# Patient Record
Sex: Female | Born: 1985 | Race: White | Hispanic: No | Marital: Single | Smoking: Former smoker
Health system: Southern US, Community
[De-identification: ages and names within clinical notes are randomized; demographics above are authoritative.]

## PROBLEM LIST (undated history)

## (undated) DIAGNOSIS — M5430 Sciatica, unspecified side: Secondary | ICD-10-CM

## (undated) DIAGNOSIS — M419 Scoliosis, unspecified: Secondary | ICD-10-CM

## (undated) HISTORY — PX: OTHER SURGICAL HISTORY: SHX169

---

## 2013-02-13 ENCOUNTER — Encounter (HOSPITAL_COMMUNITY): Payer: Self-pay | Admitting: Emergency Medicine

## 2013-02-13 ENCOUNTER — Emergency Department (HOSPITAL_COMMUNITY)
Admission: EM | Admit: 2013-02-13 | Discharge: 2013-02-13 | Disposition: A | Discharge status: Home / Self Care | Payer: BC Managed Care – PPO | Attending: Emergency Medicine | Admitting: Emergency Medicine

## 2013-02-13 DIAGNOSIS — J069 Acute upper respiratory infection, unspecified: Secondary | ICD-10-CM

## 2013-02-13 DIAGNOSIS — R112 Nausea with vomiting, unspecified: Secondary | ICD-10-CM | POA: Insufficient documentation

## 2013-02-13 DIAGNOSIS — J111 Influenza due to unidentified influenza virus with other respiratory manifestations: Secondary | ICD-10-CM

## 2013-02-13 DIAGNOSIS — F17200 Nicotine dependence, unspecified, uncomplicated: Secondary | ICD-10-CM | POA: Insufficient documentation

## 2013-02-13 MED ORDER — ACETAMINOPHEN 325 MG PO TABS
650.0000 mg | ORAL_TABLET | Freq: Once | ORAL | Status: AC
  Administered 2013-02-13: 650 mg via ORAL
  Filled 2013-02-13: qty 2

## 2013-02-13 MED ORDER — ONDANSETRON 8 MG PO TBDP
8.0000 mg | ORAL_TABLET | Freq: Once | ORAL | Status: AC
  Administered 2013-02-13: 8 mg via ORAL
  Filled 2013-02-13: qty 1

## 2013-02-13 MED ORDER — OSELTAMIVIR PHOSPHATE 75 MG PO CAPS: 75.0000 mg | ORAL_CAPSULE | Freq: Two times a day (BID) | ORAL | Status: DC

## 2013-02-13 NOTE — ED Notes (Signed)
Bed: WA11 Expected date:  Expected time:  Means of arrival:  Comments: 

## 2013-02-13 NOTE — Discharge Instructions (Signed)
Rest. Drink plenty of fluids. For fever and body aches, take tylenol/advil as need. Take tamiflu as prescribed. Follow up with primary care doctor in 1 week if symptoms fail to improve/resolve. Return to ER if worse, trouble breathing, persistent vomiting, vomiting of blood, other concern.    Influenza, Adult Influenza ("the flu") is a viral infection of the respiratory tract. It occurs more often in winter months because people spend more time in close contact with one another. Influenza can make you feel very sick. Influenza easily spreads from person to person (contagious). CAUSES  Influenza is caused by a virus that infects the respiratory tract. You can catch the virus by breathing in droplets from an infected person's cough or sneeze. You can also catch the virus by touching something that was recently contaminated with the virus and then touching your mouth, nose, or eyes. SYMPTOMS  Symptoms typically last 4 to 10 days and may include:  Fever.  Chills.  Headache, body aches, and muscle aches.  Sore throat.  Chest discomfort and cough.  Poor appetite.  Weakness or feeling tired.  Dizziness.  Nausea or vomiting. DIAGNOSIS  Diagnosis of influenza is often made based on your history and a physical exam. A nose or throat swab test can be done to confirm the diagnosis. RISKS AND COMPLICATIONS You may be at risk for a more severe case of influenza if you smoke cigarettes, have diabetes, have chronic heart disease (such as heart failure) or lung disease (such as asthma), or if you have a weakened immune system. Elderly people and pregnant women are also at risk for more serious infections. The most common complication of influenza is a lung infection (pneumonia). Sometimes, this complication can require emergency medical care and may be life-threatening. PREVENTION  An annual influenza vaccination (flu shot) is the best way to avoid getting influenza. An annual flu shot is now  routinely recommended for all adults in the [place] TREATMENT  In mild cases, influenza goes away on its own. Treatment is directed at relieving symptoms. For more severe cases, your caregiver may prescribe antiviral medicines to shorten the sickness. Antibiotic medicines are not effective, because the infection is caused by a virus, not by bacteria. HOME CARE INSTRUCTIONS  Only take over-the-counter or prescription medicines for pain, discomfort, or fever as directed by your caregiver.  Use a cool mist humidifier to make breathing easier.  Get plenty of rest until your temperature returns to normal. This usually takes 3 to 4 days.  Drink enough fluids to keep your urine clear or pale yellow.  Cover your mouth and nose when coughing or sneezing, and wash your hands well to avoid spreading the virus.  Stay home from work or school until your fever has been gone for at least 1 full day. SEEK MEDICAL CARE IF:   You have chest pain or a deep cough that worsens or produces more mucus.  You have nausea, vomiting, or diarrhea. SEEK IMMEDIATE MEDICAL CARE IF:   You have difficulty breathing, shortness of breath, or your skin or nails turn bluish.  You have severe neck pain or stiffness.  You have a severe headache, facial pain, or earache.  You have a worsening or recurring fever.  You have nausea or vomiting that cannot be controlled. MAKE SURE YOU:  Understand these instructions.  Will watch your condition.  Will get help right away if you are not doing well or get worse. Document Released: 12/31/1999 Document Revised: 07/04/2011 Document Reviewed: 04/03/2011 [employer] Patient  Information 2014 [place], [place].    Viral Infections A virus is a type of germ. Viruses can cause:  Minor sore throats.  Aches and pains.  Headaches.  Runny nose.  Rashes.  Watery eyes.  Tiredness.  Coughs.  Loss of appetite.  Feeling sick to your stomach (nausea).  Throwing up  (vomiting).  Watery poop (diarrhea). HOME CARE   Only take medicines as told by your doctor.  Drink enough water and fluids to keep your pee (urine) clear or pale yellow. Sports drinks are a good choice.  Get plenty of rest and eat healthy. Soups and broths with crackers or rice are fine. GET HELP RIGHT AWAY IF:   You have a very bad headache.  You have shortness of breath.  You have chest pain or neck pain.  You have an unusual rash.  You cannot stop throwing up.  You have watery poop that does not stop.  You cannot keep fluids down.  You or your child has a temperature by mouth above 102 F (38.9 C), not controlled by medicine.  Your baby is older than 3 months with a rectal temperature of 102 F (38.9 C) or higher.  Your baby is 82 months old or younger with a rectal temperature of 100.4 F (38 C) or higher. MAKE SURE YOU:   Understand these instructions.  Will watch this condition.  Will get help right away if you are not doing well or get worse. Document Released: 12/16/2007 Document Revised: 03/27/2011 Document Reviewed: 05/10/2010 [facility] Patient Information 2014 [place], [place].    Nausea and Vomiting Nausea is a sick feeling that often comes before throwing up (vomiting). Vomiting is a reflex where stomach contents come out of your mouth. Vomiting can cause severe loss of body fluids (dehydration). Children and elderly adults can become dehydrated quickly, especially if they also have diarrhea. Nausea and vomiting are symptoms of a condition or disease. It is important to find the cause of your symptoms. CAUSES   Direct irritation of the stomach lining. This irritation can result from increased acid production (gastroesophageal reflux disease), infection, food poisoning, taking certain medicines (such as nonsteroidal anti-inflammatory drugs), alcohol use, or tobacco use.  Signals from the brain.These signals could be caused by a headache, heat exposure,  an inner ear disturbance, increased pressure in the brain from injury, infection, a tumor, or a concussion, pain, emotional stimulus, or metabolic problems.  An obstruction in the gastrointestinal tract (bowel obstruction).  Illnesses such as diabetes, hepatitis, gallbladder problems, appendicitis, kidney problems, cancer, sepsis, atypical symptoms of a heart attack, or eating disorders.  Medical treatments such as chemotherapy and radiation.  Receiving medicine that makes you sleep (general anesthetic) during surgery. DIAGNOSIS Your caregiver may ask for tests to be done if the problems do not improve after a few days. Tests may also be done if symptoms are severe or if the reason for the nausea and vomiting is not clear. Tests may include:  Urine tests.  Blood tests.  Stool tests.  Cultures (to look for evidence of infection).  X-rays or other imaging studies. Test results can help your caregiver make decisions about treatment or the need for additional tests. TREATMENT You need to stay well hydrated. Drink frequently but in small amounts.You may wish to drink water, sports drinks, clear broth, or eat frozen ice pops or gelatin dessert to help stay hydrated.When you eat, eating slowly may help prevent nausea.There are also some antinausea medicines that may help prevent nausea. HOME CARE  INSTRUCTIONS   Take all medicine as directed by your caregiver.  If you do not have an appetite, do not force yourself to eat. However, you must continue to drink fluids.  If you have an appetite, eat a normal diet unless your caregiver tells you differently.  Eat a variety of complex carbohydrates (rice, wheat, potatoes, bread), lean meats, yogurt, fruits, and vegetables.  Avoid high-fat foods because they are more difficult to digest.  Drink enough water and fluids to keep your urine clear or pale yellow.  If you are dehydrated, ask your caregiver for specific rehydration instructions.  Signs of dehydration may include:  Severe thirst.  Dry lips and mouth.  Dizziness.  Dark urine.  Decreasing urine frequency and amount.  Confusion.  Rapid breathing or pulse. SEEK IMMEDIATE MEDICAL CARE IF:   You have blood or brown flecks (like coffee grounds) in your vomit.  You have black or bloody stools.  You have a severe headache or stiff neck.  You are confused.  You have severe abdominal pain.  You have chest pain or trouble breathing.  You do not urinate at least once every 8 hours.  You develop cold or clammy skin.  You continue to vomit for longer than 24 to 48 hours.  You have a fever. MAKE SURE YOU:   Understand these instructions.  Will watch your condition.  Will get help right away if you are not doing well or get worse. Document Released: 01/02/2005 Document Revised: 03/27/2011 Document Reviewed: 06/01/2010 [facility] Patient Information 2014 [place], [place].

## 2013-02-13 NOTE — ED Notes (Signed)
Per pt request.  Mom updated about care in lobby.

## 2013-02-13 NOTE — ED Notes (Signed)
Pt reports n/v/chills, non productive cough, fever, vomiting (with bright red blood), and body aches 6/10 x1 day. Reports son has been sick.

## 2013-02-13 NOTE — ED Provider Notes (Signed)
CSN: [id number]     Arrival date & time 02/13/13  1054 History   First MD Initiated Contact with Patient 02/13/13 1122     Chief Complaint  Patient presents with  . Influenza  . blood in vomit    (Consider location/radiation/quality/duration/timing/severity/associated sxs/prior Treatment) Patient is a 28 y.o. female presenting with flu symptoms. The history is provided by the patient.  Influenza Presenting symptoms: cough, fever, myalgias, nausea and vomiting   Presenting symptoms: no diarrhea, no headaches, no shortness of breath and no sore throat   Associated symptoms: nasal congestion   Associated symptoms: no chills   pt c/o non prod cough, scratchy/sore throat, body aches, congestion, subj fevers, for the past 1 day. Also notes had episode emesis earlier today, blood tinged. No abd pain.  No melena or rectal bleeding. No hx pud. No abd distension. No abd pain. Having normal bms. No cp or sob. States child w recent similar uri symptoms, txd w tamiflu.     History reviewed. No pertinent past medical history. History reviewed. No pertinent past surgical history. History reviewed. No pertinent family history. History  Substance Use Topics  . Smoking status: Current Every Day Smoker -- 0.50 packs/day for 9 years    Types: Cigarettes  . Smokeless tobacco: Not on file  . Alcohol Use: No   OB History   Grav Para Term Preterm Abortions TAB SAB Ect Mult Living                 Review of Systems  Constitutional: Positive for fever. Negative for chills.  HENT: Positive for congestion. Negative for sore throat.   Eyes: Negative for discharge and redness.  Respiratory: Positive for cough. Negative for shortness of breath.   Cardiovascular: Negative for chest pain and leg swelling.  Gastrointestinal: Positive for nausea and vomiting. Negative for abdominal pain, diarrhea, constipation and blood in stool.  Genitourinary: Negative for dysuria, flank pain, vaginal bleeding and vaginal  discharge.  Musculoskeletal: Positive for myalgias. Negative for back pain and neck pain.  Skin: Negative for rash.  Neurological: Negative for headaches.  Hematological: Does not bruise/bleed easily.  Psychiatric/Behavioral: Negative for confusion.    Allergies  Phenergan and Sulfa antibiotics  Home Medications  No current outpatient prescriptions on file. BP 106/62  Pulse 115  Temp(Src) 100.7 F (38.2 C) (Oral)  Resp 16  SpO2 96% Physical Exam  Nursing note and vitals reviewed. Constitutional: She is oriented to person, place, and time. She appears well-developed and well-nourished. No distress.  HENT:  Mouth/Throat: Oropharynx is clear and moist.  Nasal congestion  Eyes: Conjunctivae are normal. Pupils are equal, round, and reactive to light. No scleral icterus.  Neck: Neck supple. No tracheal deviation present.  No stiffness or rigidity  Cardiovascular: Normal rate, regular rhythm, normal heart sounds and intact distal pulses.  Exam reveals no gallop and no friction rub.   No murmur heard. Pulmonary/Chest: Effort normal and breath sounds normal. No respiratory distress.  Abdominal: Soft. Normal appearance and bowel sounds are normal. She exhibits no distension and no mass. There is no tenderness. There is no rebound and no guarding.  Genitourinary:  No cva tenderness  Musculoskeletal: She exhibits no edema and no tenderness.  Lymphadenopathy:    She has no cervical adenopathy.  Neurological: She is alert and oriented to person, place, and time.  Skin: Skin is warm and dry. No rash noted. She is not diaphoretic.  No rash, no petechia  Psychiatric: She has a normal mood  and affect.    ED Course  Procedures (including critical care time)   MDM  zofran po. Po fluids.  Reviewed nursing notes and prior charts for additional history.   Recheck tolerating po fluids.   No recurrent nv. abd soft nt.  Symptoms/exam appear most c/w viral illness, probable flu.  Pt  appears stable for d/c.      Suzi Roots, MD 02/13/13 1256

## 2013-02-13 NOTE — Progress Notes (Signed)
[facility] provided pt with a list of primary care resources. Patient stated that she was pending insurance through her job.

## 2013-03-04 ENCOUNTER — Emergency Department (HOSPITAL_COMMUNITY): Payer: BC Managed Care – PPO

## 2013-03-04 ENCOUNTER — Emergency Department (HOSPITAL_COMMUNITY)
Admission: EM | Admit: 2013-03-04 | Discharge: 2013-03-04 | Disposition: A | Discharge status: Home / Self Care | Payer: BC Managed Care – PPO | Attending: Emergency Medicine | Admitting: Emergency Medicine

## 2013-03-04 ENCOUNTER — Encounter (HOSPITAL_COMMUNITY): Payer: Self-pay | Admitting: Emergency Medicine

## 2013-03-04 DIAGNOSIS — F17200 Nicotine dependence, unspecified, uncomplicated: Secondary | ICD-10-CM | POA: Insufficient documentation

## 2013-03-04 DIAGNOSIS — M25551 Pain in right hip: Secondary | ICD-10-CM

## 2013-03-04 DIAGNOSIS — Z791 Long term (current) use of non-steroidal anti-inflammatories (NSAID): Secondary | ICD-10-CM | POA: Insufficient documentation

## 2013-03-04 DIAGNOSIS — M25559 Pain in unspecified hip: Secondary | ICD-10-CM | POA: Insufficient documentation

## 2013-03-04 DIAGNOSIS — M545 Low back pain, unspecified: Secondary | ICD-10-CM | POA: Insufficient documentation

## 2013-03-04 HISTORY — DX: Scoliosis, unspecified: M41.9

## 2013-03-04 MED ORDER — TRAMADOL HCL 50 MG PO TABS: 50.0000 mg | ORAL_TABLET | Freq: Four times a day (QID) | ORAL | Status: DC | PRN

## 2013-03-04 MED ORDER — TRAMADOL HCL 50 MG PO TABS
50.0000 mg | ORAL_TABLET | Freq: Once | ORAL | Status: AC
  Administered 2013-03-04: 50 mg via ORAL
  Filled 2013-03-04: qty 1

## 2013-03-04 NOTE — ED Provider Notes (Signed)
CSN: [id number]     Arrival date & time 03/04/13  1356 History  This chart was scribed for Junius Finner, PA working with Raeford Razor, MD by Quintella Reichert, ED Scribe. This patient was seen in room WTR9/WTR9 and the patient's care was started at 3:34 PM.   Chief Complaint  Patient presents with  . Hip Pain    The history is provided by the patient. No language interpreter was used.    HPI Comments: Kimberly Lozano is a 28 y.o. female with h/o scoliosis who presents to the Emergency Department complaining of right hip and right lower back pain that began 2 days ago.  Pt states her right hip has been popping frequently over the past several days.  She rates pain at 8/10.  She has attempted to treat pain with ibuprofen, without relief.  Pt denies previous h/o hip popping.  She denies recent falls or heavy lifting.  She denies fevers, nausea, or vomiting.  She denies new pain to any other areas.  She denies numbness or tingling in legs.  She denies any other chronic medical conditions.  She denies h/o back surgery. Denies hx of cancer or IVDU.   Pt moved here from [place] one year ago and does not have a PCP yet.  She was receiving care for her scoliosis in [place] but is unsure by what type of specialist.   Past Medical History  Diagnosis Date  . Scoliosis     Past Surgical History  Procedure Laterality Date  . Cesarian section      No family history on file.   History  Substance Use Topics  . Smoking status: Current Every Day Smoker -- 0.50 packs/day for 9 years    Types: Cigarettes  . Smokeless tobacco: Never Used  . Alcohol Use: Yes     Comment: rarely    OB History   Grav Para Term Preterm Abortions TAB SAB Ect Mult Living                   Review of Systems  Constitutional: Negative for fever.  Gastrointestinal: Negative for nausea, vomiting and diarrhea.  Musculoskeletal: Positive for arthralgias and back pain.  Neurological: Negative for weakness and  numbness.  All other systems reviewed and are negative.      Allergies  Phenergan and Sulfa antibiotics  Home Medications   Current Outpatient Rx  Name  Route  Sig  Dispense  Refill  . naproxen (NAPROSYN) 250 MG tablet   Oral   Take 500 mg by mouth 2 (two) times daily with a meal.         . traMADol (ULTRAM) 50 MG tablet   Oral   Take 1 tablet (50 mg total) by mouth every 6 (six) hours as needed.   15 tablet   0    BP 114/77  Pulse 100  Temp(Src) 98.4 F (36.9 C) (Oral)  Resp 16  SpO2 99%  Physical Exam  Nursing note and vitals reviewed. Constitutional: She is oriented to person, place, and time. She appears well-developed and well-nourished.  HENT:  Head: Normocephalic and atraumatic.  Eyes: EOM are normal.  Neck: Normal range of motion.  Cardiovascular: Normal rate.   Pulmonary/Chest: Effort normal.  Musculoskeletal: Normal range of motion. She exhibits tenderness.  Tender along lower lumbar spine and musculature, right greater than left. Mild tenderness over right hip, without crepitus.  Neurological: She is alert and oriented to person, place, and time.  Antalgic gait.  Skin:  Skin is warm and dry.  No erythema or ecchymosis.  Psychiatric: She has a normal mood and affect. Her behavior is normal.    ED Course  Procedures (including critical care time)  DIAGNOSTIC STUDIES: Oxygen Saturation is 99% on room air, normal by my interpretation.    COORDINATION OF CARE: 3:39 PM-Discussed treatment plan which includes x-ray with pt at bedside and pt agreed to plan.    Labs Review Labs Reviewed - No data to display  Imaging Review Dg Lumbar Spine Complete  03/04/2013   CLINICAL DATA:  Low back pain without injury  EXAM: LUMBAR SPINE - COMPLETE 4+ VIEW  COMPARISON:  None.  FINDINGS: Vertebral body height is well maintained. No spondylolysis or spondylolisthesis is noted. No significant disc pathology is seen. An IUD is noted within the midline of the  pelvis.  IMPRESSION: No acute abnormality noted.   Electronically Signed   By: Alcide CleverMark  Lukens M.D.   On: 03/04/2013 16:01   Dg Hip Complete Right  03/04/2013   CLINICAL DATA:  Right hip pain  EXAM: RIGHT HIP - COMPLETE 2+ VIEW  COMPARISON:  None.  FINDINGS: There is no evidence of hip fracture or dislocation. There is no evidence of arthropathy or other focal bone abnormality.  IMPRESSION: No acute abnormality noted.   Electronically Signed   By: Alcide CleverMark  Lukens M.D.   On: 03/04/2013 16:01    EKG Interpretation   None       MDM   Final diagnoses:  Lower back pain  Right hip pain    Pt is a 27yo female with hx of scoliosis c/o "popping" in her right hip. Pt states this is new for her. Denies recent falls or trauma. No hx of back surgery.  No red flag symptoms.  Not concerned for cauda equina or spinal abscess. Not concerned for hip fracture. Symptoms likely due to arthritis. Pt declined pain medication prior to imaging.  Plain films lumbar and right Hip: no acute abnormality noted.  Discussed imaging with pt. Advised pt she will likely need to reestablish care with an orthopedist for further evaluation and treatment of scoliosis and right hip symptoms.  Pt requested pain medication prior to discharge. Rx: tramadol. Return precautions provided. Pt verbalized understanding and agreement with tx plan.   I personally performed the services described in this documentation, which was scribed in my presence. The recorded information has been reviewed and is accurate.    Junius Finnerrin O'Malley, PA-C 03/05/13 1134

## 2013-03-04 NOTE — Discharge Instructions (Signed)
Arthralgia °Arthralgia is joint pain. A joint is a place where two bones meet. Joint pain can happen for many reasons. The joint can be bruised, stiff, infected, or weak from aging. Pain usually goes away after resting and taking medicine for soreness.  °HOME CARE °· Rest the joint as told by your doctor. °· Keep the sore joint raised (elevated) for the first 24 hours. °· Put ice on the joint area. °· Put ice in a plastic bag. °· Place a towel between your skin and the bag. °· Leave the ice on for 15-20 minutes, 03-04 times a day. °· Wear your splint, casting, elastic bandage, or sling as told by your doctor. °· Only take medicine as told by your doctor. Do not take aspirin. °· Use crutches as told by your doctor. Do not put weight on the joint until told to by your doctor. °GET HELP RIGHT AWAY IF:  °· You have bruising, puffiness (swelling), or more pain. °· Your fingers or toes turn blue or start to lose feeling (numb). °· Your medicine does not lessen the pain. °· Your pain becomes severe. °· You have a temperature by mouth above 102° F (38.9° C), not controlled by medicine. °· You cannot move or use the joint. °MAKE SURE YOU:  °· Understand these instructions. °· Will watch your condition. °· Will get help right away if you are not doing well or get worse. °Document Released: 12/21/2008 Document Revised: 03/27/2011 Document Reviewed: 12/21/2008 °ExitCare® Patient Information ©2014 [employer]. ° °Emergency Department Resource Guide °1) Find a Doctor and Pay Out of Pocket °Although you won't have to find out who is covered by your insurance plan, it is a good idea to ask around and get recommendations. You will then need to call the office and see if the doctor you have chosen will accept you as a new patient and what types of options they offer for patients who are self-pay. Some doctors offer discounts or will set up payment plans for their patients who do not have insurance, but you will need to ask so you  aren't surprised when you get to your appointment. ° °2) Contact Your Local Health Department °Not all health departments have doctors that can see patients for sick visits, but many do, so it is worth a call to see if yours does. If you don't know where your local health department is, you can check in your phone book. The CDC also has a tool to help you locate your state's health department, and many state websites also have listings of all of their local health departments. ° °3) Find a Walk-in Clinic °If your illness is not likely to be very severe or complicated, you may want to try a walk in clinic. These are popping up all over the country in pharmacies, drugstores, and shopping centers. They're usually staffed by nurse practitioners or physician assistants that have been trained to treat common illnesses and complaints. They're usually fairly quick and inexpensive. However, if you have serious medical issues or chronic medical problems, these are probably not your best option. ° °No Primary Care Doctor: °- Call [facility] at  832-8000 - they can help you locate a primary care doctor that  accepts your insurance, provides certain services, etc. °- Physician Referral Service- 1-800-533-3463 ° °Chronic Pain Problems: °Organization         Address  Phone   Notes  °[facility]  (336) 297-2271 Patients need to be referred   by their primary care doctor.  ° °Medication Assistance: °Organization         Address  Phone   Notes  °[place] [facility] [address] °[place], [place] [address] (336) 641-8030 --Must be a resident of [place] °-- Must have NO insurance coverage whatsoever (no Medicaid/ Medicare, etc.) °-- The pt. MUST have a primary care doctor that directs their care regularly and follows them in the community °  °[facility]  (866) 331-1348   °[employer]  (888) 892-1162   ° °Agencies that provide inexpensive medical care: °Organization          Address                                                       Phone                                                                            Notes  °[facility]  (336) 832-8035   °[facility]    (336) 832-7272   °[facility] [address] °[place], [place] [address] (336) 832-4777   °[facility] [address], °[place] (336) 271-4999   °[facility]    (336) 373-0678   °[facility]    (336) 272-1050   °[facility] ° [address], [place] Phone:  (336) 832-4444, Fax:  (336) 832-4440 Hours of Operation:  9 am - 6 pm, M-F.  Also accepts Medicaid/Medicare and self-pay.  °[facility] ° [address], [place] Phone: (336) 832-3150, Fax: (336) 832-3151. Hours of Operation:  8:30 am - 5:30 pm, M-F.  Also accepts Medicaid and self-pay.  °[facility] [place] [address], [place] Phone: (336) 878-6027   °[facility] [address], [place],  (336)723-1848, Ext. 123 Mondays & Thursdays: 7-9 AM.  First 15 patients are seen on a first come, first serve basis. °  ° °Medicaid-accepting [place] Providers: ° °Organization         Address                                                                       Phone                               Notes  °[facility] [address], [place] (336) 641-2100 Also accepts self-pay patients.  °[facility] [address], [place] ° (336) 856-9996   °[facility] [address],  (336) 288-8857   °[facility] [address] [place] [address], [place] (336) 299-7000   °Veita Bland [address], [place]  ° (336) 373-1557 Only accepts [place] Access Medicaid patients after they have their name applied to their card.  ° °Self-Pay (no insurance) in [place]: °  °Organization         Address                                                     Phone               Notes  °Sickle Cell Patients, [facility] [address], [place] (336) 832-1970   °[facility] [address], [place] (336) 832-4400   ° [facility] ° [address], [place] (336) 992-4800   °[facility]/Dr. Osei-Bonsu ° [address] [place] [address], [place] or [address], [place] (336) [phone] Phone number for both [place] and [place] locations is the same.  °[facility] [address], [place] (336) 299-0000   °[facility] [address] [place] [address], [place] or [address] (336) 852-7530 °(336) 878-2260   °[facility] [address], [place] (336) 350-1642, phone; (336) 294-5005, fax Sees patients 1st and 3rd Saturday of every month.  Must not qualify for public or private insurance (i.e. Medicaid, Medicare, [place] Health Choice, Veterans' Benefits) • Household income should be no more than 200% of the poverty level •The clinic cannot treat you if you are pregnant or think you are pregnant • Sexually transmitted diseases are not treated at the clinic.  ° ° °Dental Care: °Organization         Address                                  Phone                       Notes  °[facility] [address], [place] (336) 641-6152 Accepts children up to age 21 who are enrolled in Medicaid or [place] Health Choice; pregnant women with a Medicaid card; and children who have applied for Medicaid or [place] Health Choice, but were declined, whose parents can pay a reduced fee at time of service.  °[facility] [place]  [address], [place] (336) 641-7733 Accepts children up to age 21 who are enrolled in Medicaid or [place] Health Choice; pregnant women with a  Medicaid card; and children who have applied for Medicaid or  Health Choice, but were declined, whose parents can pay a reduced fee at time of service.  °[facility] ° [address],  (336) 641-4533 Patients are seen by appointment only. Walk-ins are not accepted. [facility] will see patients 18 years of age and older. °Monday - Tuesday (8am-5pm) °Most Wednesdays (8:30-5pm) °$30 per visit, cash only  °[facility] ° [address], [place] (336) 641-4533 Patients are seen by appointment only. Walk-ins are not accepted. [facility] will see patients 18 years of age and older. °  One Wednesday Evening (Monthly: Volunteer Based).  $30 per visit, cash only  °[facility]  (919) 537-3737 for adults; Children under age 4, call Graduate Pediatric Dentistry at (919) [phone]. Children aged 4-14, please call (919) [phone] to request a pediatric application. ° Dental services are provided in all areas of dental care including fillings, crowns and bridges, complete and partial dentures, implants, gum treatment, root canals, and extractions. Preventive care is also provided. Treatment is provided to both adults and children. °Patients are selected via a lottery and there is often a waiting list. °  °[facility] [address], °[place] ° (336) 763-8833 [email or web address] °  °[facility] [address], [place], [place] (336)723-1848, Ext. 123 Second and Fourth Thursday of each month, opens at 6:30 AM; Clinic ends at 9 AM.  Patients are seen on a first-come first-served basis, and a limited number are seen during each clinic.  ° °[facility] ° [address], [place], [place] (336) 723-7904   Eligibility Requirements °You must have lived in [place], [place], or [place] counties for at least the last three months. °  You cannot be eligible for state or federal sponsored healthcare  insurance, including Veterans Administration, Medicaid, or Medicare. °  You generally cannot be eligible for healthcare insurance through your employer.  °  How to apply: °Eligibility screenings are held every Tuesday and Wednesday afternoon from 1:00 pm until 4:00 pm. You do not need an appointment for the interview!  °[facility] [address], [place], [place] 336-631-2330   °[facility]  336-342-8273   °[facility]  336-703-3100   ° [facility]  336-570-6415   ° °Behavioral Health Resources in the Community: °Intensive Outpatient Programs °Organization         Address                                              Phone              Notes  °[facility] [address], [place], [place] 336-878-6098   °[facility] [address], [place], [place] 336-832-9800   °[facility] [address], [place], [place] ° 336-882-2125   °[facility] [address],  °[place], [place] 1-800-853-5163 or 336-641-4981   °Substance Abuse Resources °Organization         Address                                Phone  Notes  °[facility]  336-882-2125   °[facility]  336-784-9470   °[facility]  336-285-9073   °[facility]  336-845-3988   °Residential & Outpatient Substance Abuse Program  1-800-659-3381   °Psychological Services °Organization         Address                                  Phone                Notes  °[facility]  336- 832-9600   °[facility]  336- 378-7881   °  [facility] [address], [place] 1-800-853-5163 or 336-641-4981   ° °Mobile Crisis Teams °Organization         Address  Phone  Notes  °[facility], Mobile Crisis Care Unit  1-877-626-1772   °Assertive °[facility] ° [address] [place], [place] 336-834-9664   °Sharon DeEsch [address] °[place] 336-554-5454   ° °Self-Help/Support Groups °Organization         Address                         Phone             Notes  °[facility] [place] - variety of support groups  336- 373-1402 Call for more information  °Narcotics Anonymous (NA), [facility] [address], °[place]  2 meetings at this location  ° °Residential Treatment Programs °Organization         Address                                                    Phone              Notes  °[facility] [address],    °[place]  1-866-801-8205   °[facility] ° [address], [place], [place] 704-293-8524   °[facility] [address], [place] 336-845-3988 Admissions: 8am-3pm M-F  °[facility] [address]    °[place], [place] 336-841-1104   °[facility] [address], [place], [place] 336-379-7146   °[facility] [address]  °[place], [place] 336-285-9073   °[facility] - Intensive Outpatient [address], [place], [place] 336-852-3033   °[facility] [address]  °[place], [place] 1-877-615-2722 or 336-784-9470   °[facility] ([facility] [address] [place], [place] 336-227-7417 Accepts Medicaid  °[facility] [address]  ° [place] 1-800-659-3381 Substance Abuse/Addiction Treatment  ° °[place] Behavioral Health Resources °Organization         Address                                                            Phone                    Notes  °[facility]  (888) 581-9988   °Julie Brannon, PhD [address] [place], [place]   (336) 349-5553 or (336) 951-0000   ° Behavioral   [address] °[place], [place] (336) 349-4454   °[facility] [address], [place], [place] (336) 342-8316 Insurance/Medicaid/sponsorship through [facility]  °[facility] [address]                                    [place],  (336) 342-8316 Therapy/tele-psych/case  °[facility] [address]  ° [place],  (336) 349-2233    °Dr. Arfeen  (  336) 349-4544   °[facility] [place]  [employer] [place] [facility] 1) [address], [place] °2) [address], [place] °3)  [address], [place] (336) 349-3220 °(336) 342-7768 ° °(336) 342-8140   °[place] [facility] (336) 342-1394 or (336) 342-3537 (After Hours)    ° ° ° ° ° °

## 2013-03-04 NOTE — ED Notes (Signed)
Pt a+ox4, presents with c/o R hip "popping", x3 days, pt reports now pain/popping in L hip also.  8/10 pain.  Pt denies hx similar.  Pt denies n/t to extremities, ambulating independently with ease.  Pt denies other complaints.  Skin PWD.  MAEI.  Speaking full/clear sentences.  NAD.

## 2013-03-08 NOTE — ED Provider Notes (Signed)
Medical screening examination/treatment/procedure(s) were performed by non-physician practitioner and as supervising physician I was immediately available for consultation/collaboration.  EKG Interpretation   None        Terianne Thaker, MD 03/08/13 0939 

## 2014-03-07 IMAGING — CR DG LUMBAR SPINE COMPLETE 4+V
5 series · 5 of 5 positions shown · non-contrast
Comparison: None.

CLINICAL DATA: Low back pain without injury

EXAM:
LUMBAR SPINE - COMPLETE 4+ VIEW

[t lumbar spine ap]
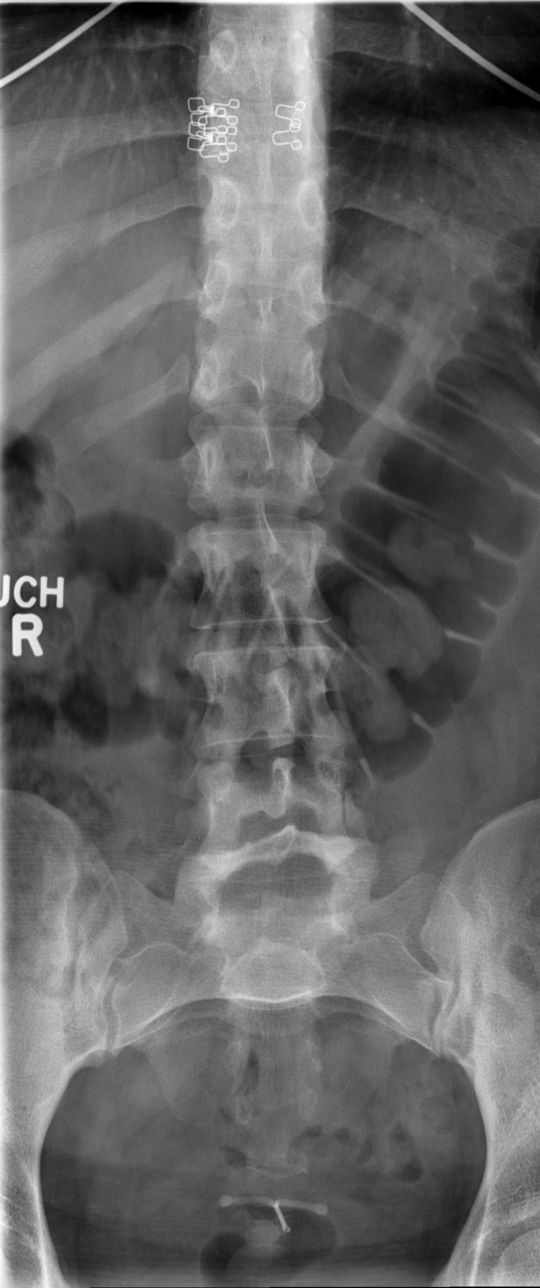

[t lumbar spine obl (1 of 2)]
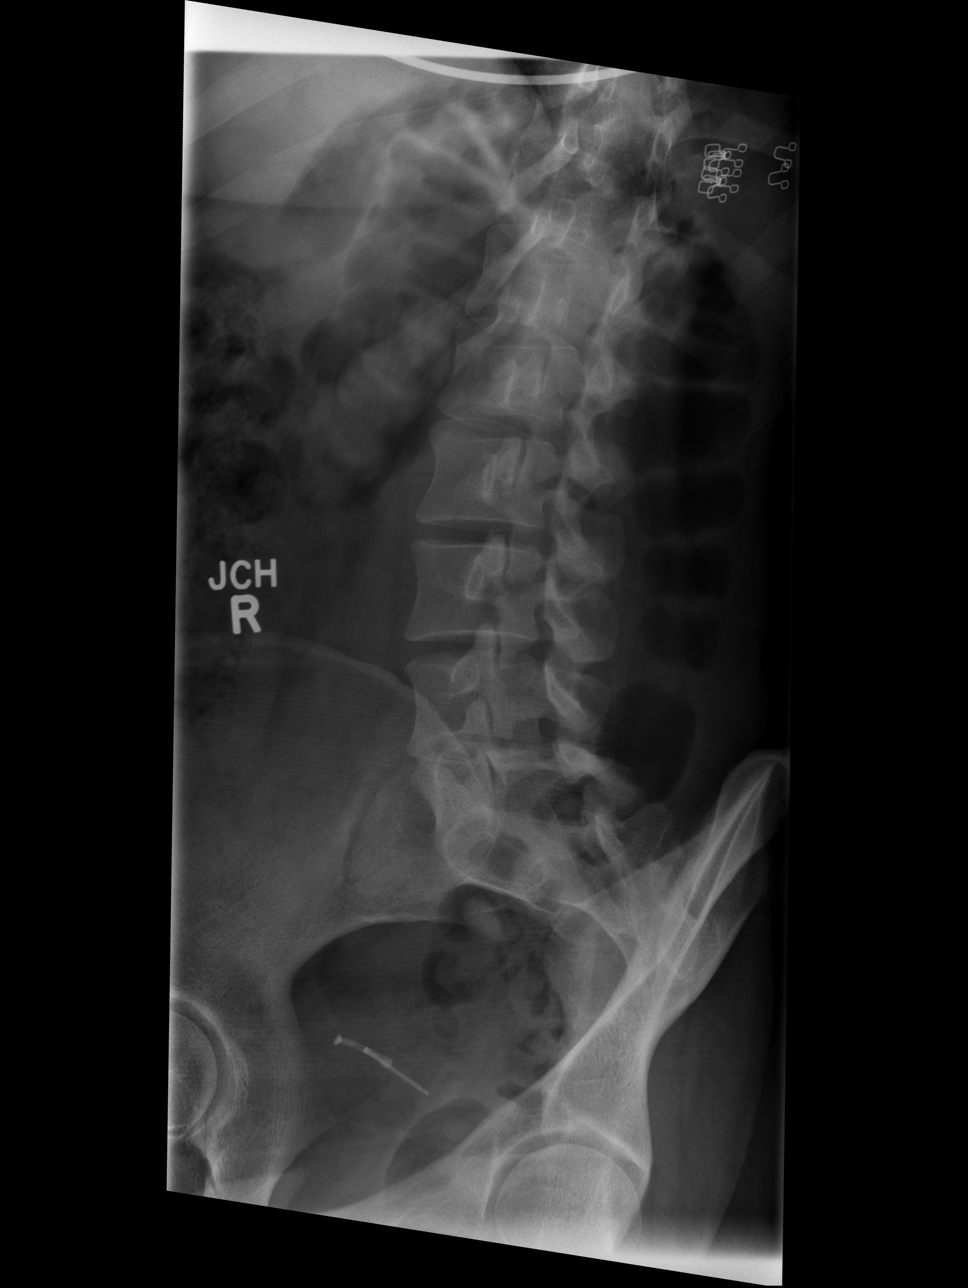

[t lumbar spine obl (2 of 2)]
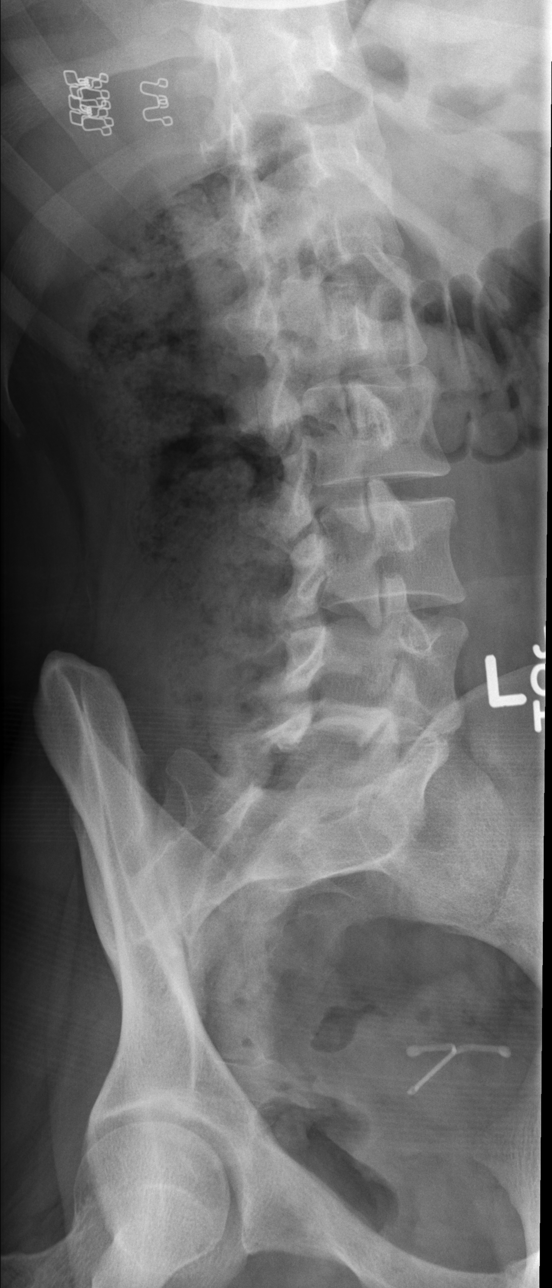

[t lumbar spine lat]
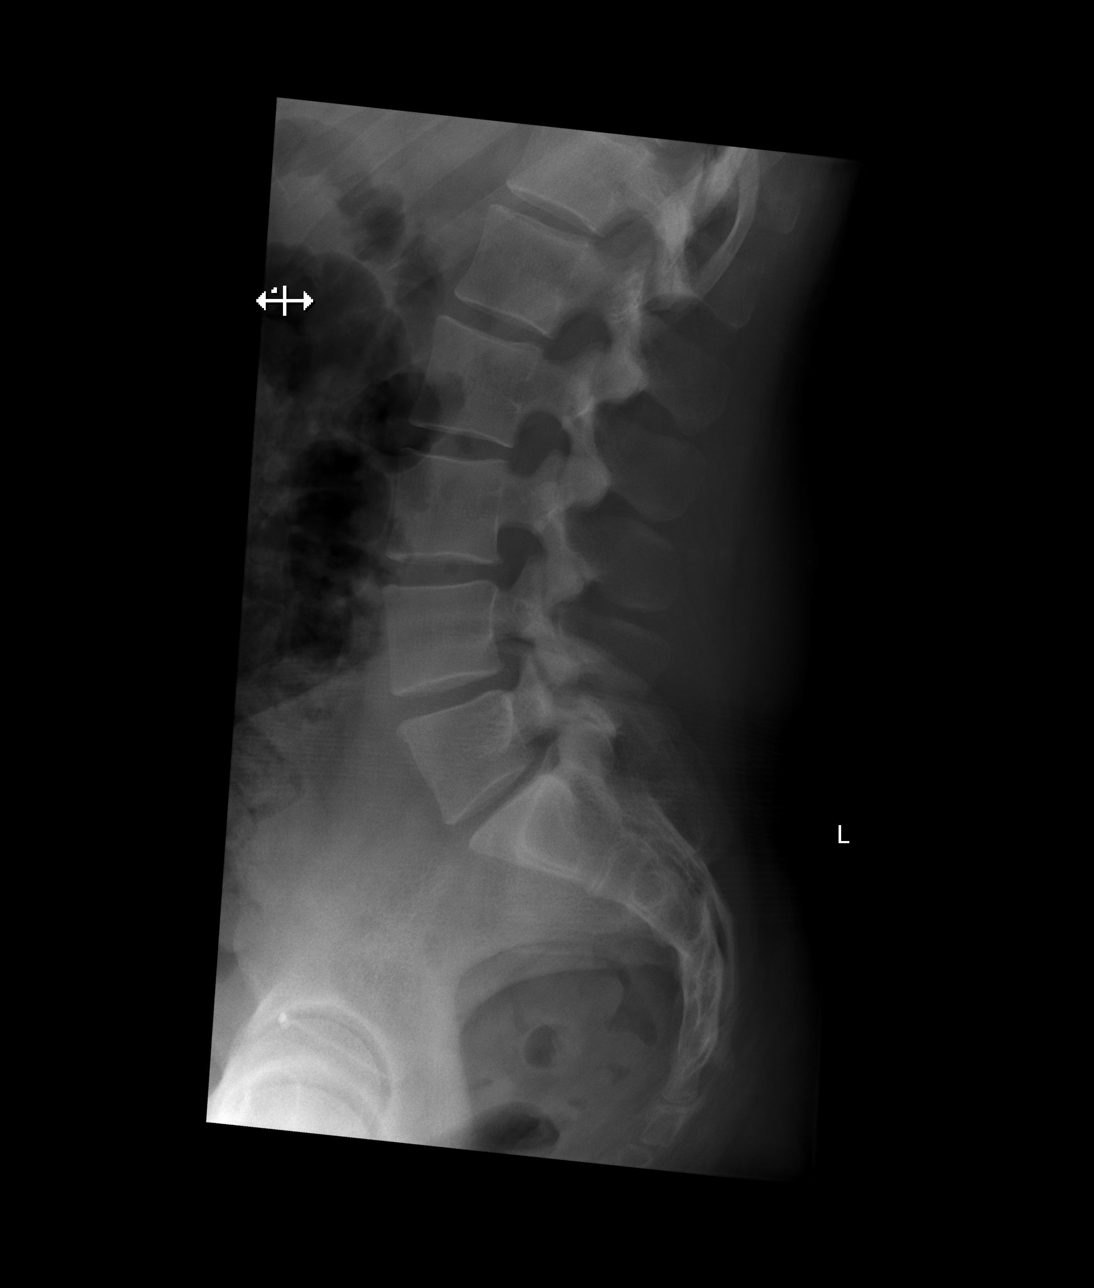

[t lumbar l-5 s-1 spot]
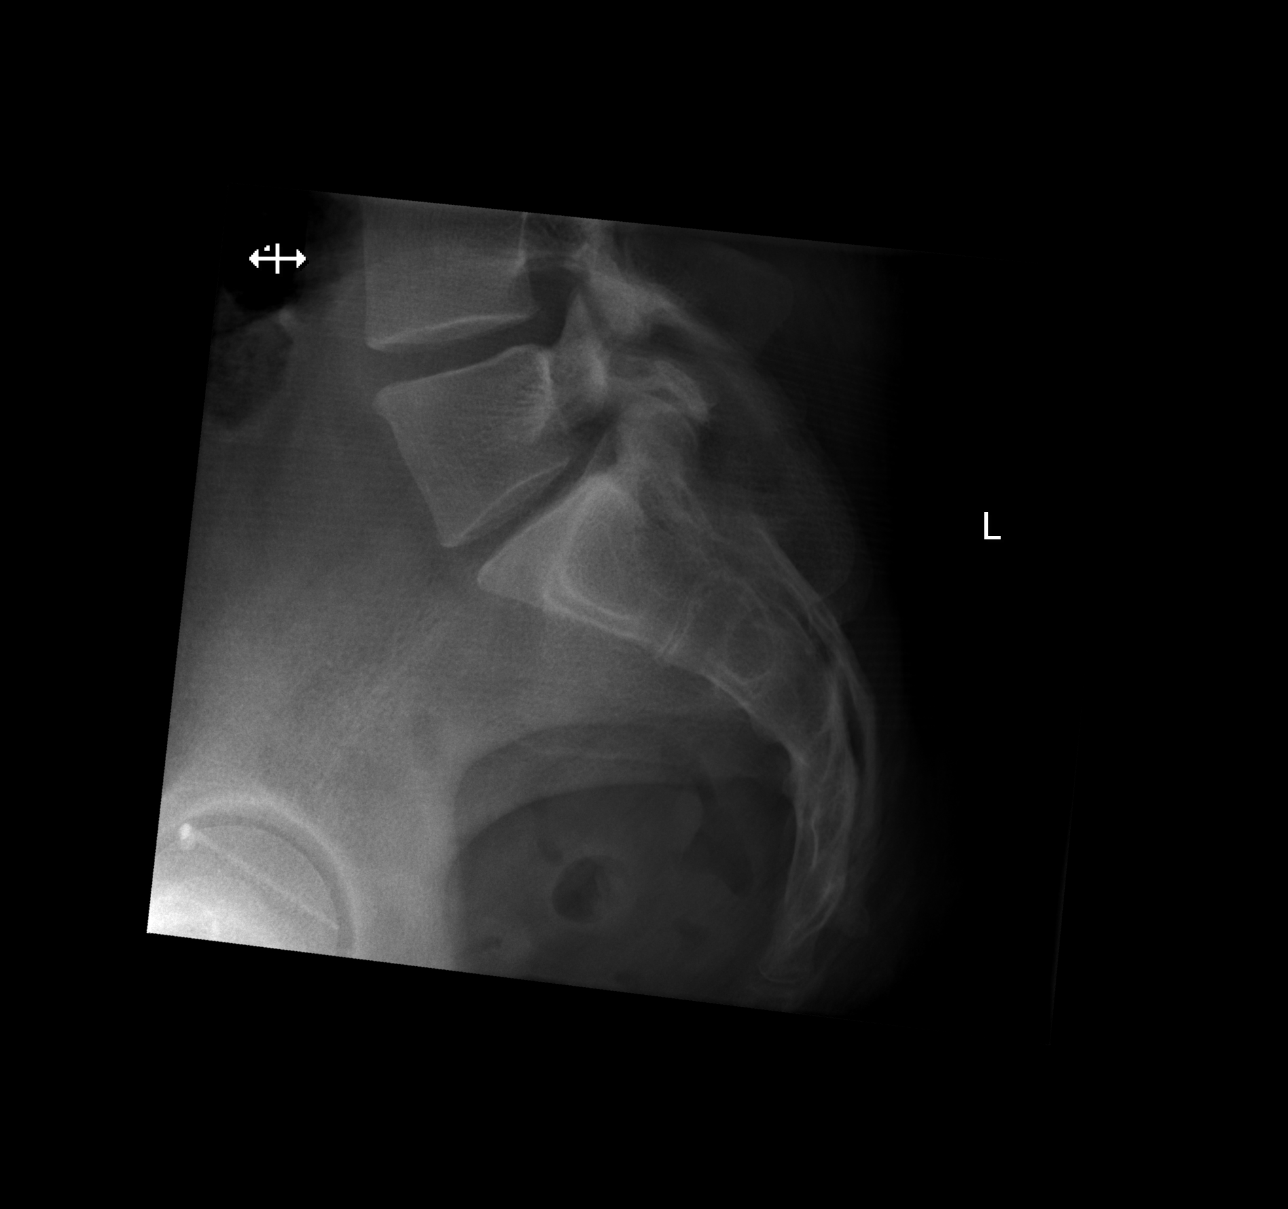

[5 of 5 positions shown; findings below may reference images not displayed]

FINDINGS: Vertebral body height is well maintained. No spondylolysis or
spondylolisthesis is noted. No significant disc pathology is seen.
An IUD is noted within the midline of the pelvis.
IMPRESSION: No acute abnormality noted.

## 2016-04-03 ENCOUNTER — Emergency Department (HOSPITAL_COMMUNITY)
Admission: EM | Admit: 2016-04-03 | Discharge: 2016-04-03 | Disposition: A | Discharge status: Home / Self Care | Payer: BLUE CROSS/BLUE SHIELD | Attending: Emergency Medicine | Admitting: Emergency Medicine

## 2016-04-03 ENCOUNTER — Encounter (HOSPITAL_COMMUNITY): Payer: Self-pay | Admitting: Emergency Medicine

## 2016-04-03 DIAGNOSIS — M545 Low back pain, unspecified: Secondary | ICD-10-CM

## 2016-04-03 DIAGNOSIS — F17210 Nicotine dependence, cigarettes, uncomplicated: Secondary | ICD-10-CM | POA: Diagnosis not present

## 2016-04-03 DIAGNOSIS — Z79899 Other long term (current) drug therapy: Secondary | ICD-10-CM | POA: Diagnosis not present

## 2016-04-03 HISTORY — DX: Sciatica, unspecified side: M54.30

## 2016-04-03 LAB — URINALYSIS, ROUTINE W REFLEX MICROSCOPIC
Bilirubin Urine: NEGATIVE
Glucose, UA: NEGATIVE mg/dL
KETONES UR: NEGATIVE mg/dL
Leukocytes, UA: NEGATIVE
Nitrite: NEGATIVE
Protein, ur: NEGATIVE mg/dL
Specific Gravity, Urine: 1.023 (ref 1.005–1.030)
pH: 5 (ref 5.0–8.0)

## 2016-04-03 LAB — PREGNANCY, URINE: Preg Test, Ur: NEGATIVE

## 2016-04-03 MED ORDER — IBUPROFEN 800 MG PO TABS: 800.0000 mg | ORAL_TABLET | Freq: Three times a day (TID) | ORAL | 0 refills | Status: DC

## 2016-04-03 MED ORDER — KETOROLAC TROMETHAMINE 30 MG/ML IJ SOLN
30.0000 mg | Freq: Once | INTRAMUSCULAR | Status: AC
  Administered 2016-04-03: 30 mg via INTRAMUSCULAR
  Filled 2016-04-03: qty 1

## 2016-04-03 NOTE — ED Notes (Signed)
Pt is aware that a urine sample is needed and will let us know when she can provide one for us.

## 2016-04-03 NOTE — ED Triage Notes (Signed)
Pt reports she began to have bilateral lower back pain yesterday. No known injury. Denies urinary or vaginal symptoms.

## 2016-04-03 NOTE — ED Provider Notes (Signed)
[facility]EM[facility]ENCY DEPT Provider Note   CSN: [id number] Arrival date & time: 04/03/16 [id number]     History    Chief Complaint  Patient presents with  . Back Pain     HPI Kimberly Lozano is a 31 y.o. female.  31yo F w/ h/o sciatica who p/w Low back pain. Yesterday the patient began having low back pain which is in a band across her lower back the pain has been constant and moderate in severity. She denies any associated dysuria, hematuria, or vaginal discharge. No nausea, vomiting, fevers, or recent illness. No lower extremity weakness or numbness. She reports a history of similar symptoms a long time ago when she had problems with sciatica. No trauma or change in physical activity. No unintentional weight loss, history of cancer, or IV drug use. She took Flexeril and an anti-inflammatory last night with no relief.   Past Medical History:  Diagnosis Date  . Sciatica   . Scoliosis      There are no active problems to display for this patient.   Past Surgical History:  Procedure Laterality Date  . cesarian section      OB History    No data available        Home Medications    Prior to Admission medications   Medication Sig Start Date End Date Taking? Authorizing Provider  cyclobenzaprine (FLEXERIL) 10 MG tablet Take 10 mg by mouth 3 (three) times daily as needed for muscle spasms.   Yes Historical Provider, MD  naproxen (NAPROSYN) 250 MG tablet Take 500 mg by mouth 2 (two) times daily with a meal.   Yes Historical Provider, MD  ibuprofen (ADVIL,MOTRIN) 800 MG tablet Take 1 tablet (800 mg total) by mouth 3 (three) times daily. 04/03/16   Laurence Spatesachel [place] Rogue Rafalski, MD      History reviewed. No pertinent family history.   Social History  Substance Use Topics  . Smoking status: Current Every Day Smoker    Packs/day: 0.50    Years: 9.00    Types: Cigarettes  . Smokeless tobacco: Never Used  . Alcohol use Yes     Comment: rarely     Allergies     Phenergan  [promethazine hcl] and Sulfa antibiotics    Review of Systems  10 Systems reviewed and are negative for acute change except as noted in the HPI.   Physical Exam Updated Vital Signs BP (!) 116/99 (BP Location: Right Arm)   Pulse 78   Temp 97.7 F (36.5 C) (Oral)   Resp 18   Ht 5\' 4"  (1.626 m)   Wt 199 lb (90.3 kg)   SpO2 99%   BMI 34.16 kg/m   Physical Exam  Constitutional: She is oriented to person, place, and time. She appears well-developed and well-nourished. No distress.  HENT:  Head: Normocephalic and atraumatic.  Moist mucous membranes  Eyes: Conjunctivae are normal. Pupils are equal, round, and reactive to light.  Neck: Neck supple.  Cardiovascular: Normal rate, regular rhythm and normal heart sounds.   No murmur heard. Pulmonary/Chest: Effort normal and breath sounds normal.  Abdominal: Soft. Bowel sounds are normal. She exhibits no distension. There is no tenderness.  Musculoskeletal: She exhibits no edema.  Tenderness across entire lower back including paraspinal muscles, no midline spinal stepoff  Neurological: She is alert and oriented to person, place, and time. She displays normal reflexes. No sensory deficit. She exhibits normal muscle tone.  5/5 strength and normal sensation BLE Fluent speech  Skin: Skin is warm  and dry.  Psychiatric: She has a normal mood and affect. Judgment normal.  Nursing note and vitals reviewed.     ED Treatments / Results  Labs (all labs ordered are listed, but only abnormal results are displayed) Labs Reviewed  URINALYSIS, ROUTINE W REFLEX MICROSCOPIC - Abnormal; Notable for the following:       Result Value   APPearance HAZY (*)    Hgb urine dipstick SMALL (*)    Bacteria, UA RARE (*)    Squamous Epithelial / LPF 0-5 (*)    All other components within normal limits  PREGNANCY, URINE     EKG  EKG Interpretation  Date/Time:    Ventricular Rate:    PR Interval:    QRS Duration:   QT Interval:    QTC  Calculation:   R Axis:     Text Interpretation:           Radiology No results found.  Procedures Procedures (including critical care time) Procedures  Medications Ordered in ED  Medications  ketorolac (TORADOL) 30 MG/ML injection 30 mg (30 mg Intramuscular Given 04/03/16 1039)     Initial Impression / Assessment and Plan / ED Course  I have reviewed the triage vital signs and the nursing notes.       Pt w/ low back pain since yesterday, no trauma or illness and h/o similar sx. She was neurologically intact w/ normal strength and sensation distally. UA reassuring, UPT negative. Gave IM toradol in ED.  Patient demonstrates no lower extremity weakness, saddle anesthesia, bowel or bladder incontinence, or any other neurologic deficits concerning for cauda equina. No fevers or other infectious symptoms to suggest by the patient's back pain is due to an infection. I have reviewed return precautions, including the development of any of these signs or symptoms, and the patient has voiced understanding. I reviewed supportive care instructions, including NSAIDs, early range of motion exercises, and PCP follow-up if symptoms do not improve for referral to physical therapy. Patient voiced understanding and was discharged in satisfactory condition.   Final Clinical Impressions(s) / ED Diagnoses   Final diagnoses:  Acute bilateral low back pain without sciatica     Discharge Medication List as of 04/03/2016 10:10 AM    START taking these medications   Details  ibuprofen (ADVIL,MOTRIN) 800 MG tablet Take 1 tablet (800 mg total) by mouth 3 (three) times daily., Starting Mon 04/03/2016, Print           Laurence Spates, MD 04/03/16 [phone]

## 2016-04-03 NOTE — ED Notes (Signed)
Bed: WA19 Expected date:  Expected time:  Means of arrival:  Comments: 

## 2016-08-02 ENCOUNTER — Encounter (HOSPITAL_COMMUNITY): Payer: Self-pay | Admitting: Emergency Medicine

## 2016-08-02 ENCOUNTER — Emergency Department (HOSPITAL_COMMUNITY)
Admission: EM | Admit: 2016-08-02 | Discharge: 2016-08-02 | Disposition: A | Discharge status: Home / Self Care | Payer: BLUE CROSS/BLUE SHIELD | Attending: Emergency Medicine | Admitting: Emergency Medicine

## 2016-08-02 DIAGNOSIS — Z79899 Other long term (current) drug therapy: Secondary | ICD-10-CM | POA: Insufficient documentation

## 2016-08-02 DIAGNOSIS — K029 Dental caries, unspecified: Secondary | ICD-10-CM | POA: Diagnosis not present

## 2016-08-02 DIAGNOSIS — K0889 Other specified disorders of teeth and supporting structures: Secondary | ICD-10-CM | POA: Diagnosis present

## 2016-08-02 DIAGNOSIS — F17210 Nicotine dependence, cigarettes, uncomplicated: Secondary | ICD-10-CM | POA: Insufficient documentation

## 2016-08-02 MED ORDER — BUPIVACAINE-EPINEPHRINE (PF) 0.5% -1:200000 IJ SOLN
1.8000 mL | Freq: Once | INTRAMUSCULAR | Status: AC
  Administered 2016-08-02: 1.8 mL
  Filled 2016-08-02: qty 1.8

## 2016-08-02 MED ORDER — PENICILLIN V POTASSIUM 500 MG PO TABS: 500.0000 mg | ORAL_TABLET | Freq: Four times a day (QID) | ORAL | 0 refills | Status: AC

## 2016-08-02 MED ORDER — CHLORHEXIDINE GLUCONATE 0.12 % MT SOLN: 15.0000 mL | Freq: Two times a day (BID) | OROMUCOSAL | 0 refills | Status: DC

## 2016-08-02 NOTE — ED Notes (Signed)
Bed: WTR5 Expected date:  Expected time:  Means of arrival:  Comments: 

## 2016-08-02 NOTE — ED Triage Notes (Signed)
Pt complaint of right frontal tooth pain onset Sunday.

## 2016-08-02 NOTE — ED Provider Notes (Signed)
[facility]-EMERGENCY DEPT Provider Note   CSN: [id number] Arrival date & time: 08/02/16  4098  By signing my name below, I, Cynda Acres, attest that this documentation has been prepared under the direction and in the presence of Anntionette Madkins, PA-C. Electronically Signed: Cynda Acres, Scribe. 08/02/16. 10:23 AM.  History   Chief Complaint Chief Complaint  Patient presents with  . Dental Pain    HPI Comments: Kimberly Lozano is a 31 y.o. female with no pertinent past medical history, who presents to the Emergency Department complaining of sudden-onset, constant right lower dental pain that began 3 days ago. Patient states her teeth are beginning to decay, which is causing her pain. Patient reports associated right-sided facial pain. Patient is scheduled for a dental appointment on 08/08/16. Patient reports gargling with salt water and taking Advil with no relief. No recent antibiotic use. Patient denies any fever, chills, nausea, vomiting, facial swelling, trouble swallowing, or any additional symptoms.   The history is provided by the patient. No language interpreter was used.    Past Medical History:  Diagnosis Date  . Sciatica   . Scoliosis     There are no active problems to display for this patient.   Past Surgical History:  Procedure Laterality Date  . cesarian section      OB History    No data available       Home Medications    Prior to Admission medications   Medication Sig Start Date End Date Taking? Authorizing Provider  cyclobenzaprine (FLEXERIL) 10 MG tablet Take 10 mg by mouth 3 (three) times daily as needed for muscle spasms.    [provider]  ibuprofen (ADVIL,MOTRIN) 800 MG tablet Take 1 tablet (800 mg total) by mouth 3 (three) times daily. 04/03/16   Little, Ambrose Finland, MD  naproxen (NAPROSYN) 250 MG tablet Take 500 mg by mouth 2 (two) times daily with a meal.    [provider]    Family History No family history on  file.  Social History Social History  Substance Use Topics  . Smoking status: Current Every Day Smoker    Packs/day: 0.50    Years: 9.00    Types: Cigarettes  . Smokeless tobacco: Never Used  . Alcohol use Yes     Comment: rarely     Allergies   Phenergan [promethazine hcl] and Sulfa antibiotics   Review of Systems Review of Systems  Constitutional: Negative for chills and fever.  HENT: Positive for dental problem. Negative for facial swelling and trouble swallowing.   Gastrointestinal: Negative for nausea and vomiting.  All other systems reviewed and are negative.    Physical Exam Updated Vital Signs BP (!) 120/92 (BP Location: Right Arm)   Pulse 86   Temp 98.6 F (37 C) (Oral)   Resp 16   SpO2 94%   Physical Exam  Constitutional: She is oriented to person, place, and time. She appears well-developed and well-nourished.  HENT:  Head: Normocephalic and atraumatic.  Severely poor dentition throughout with wide spread dental decay and gingivitis. Erythema and swelling to the right lower gum over central and lateral incisors. No trismus. No swelling under the tongue. No submandibular tenderness, swelling, or fullness.   Eyes: Pupils are equal, round, and reactive to light. EOM are normal.  Neck: Normal range of motion. Neck supple.  Cardiovascular: Normal rate.   Pulmonary/Chest: Effort normal.  Neurological: She is alert and oriented to person, place, and time.  Skin: Skin is warm and dry.  Psychiatric: She has a normal mood and affect.  Nursing note and vitals reviewed.    ED Treatments / Results  DIAGNOSTIC STUDIES: Oxygen Saturation is 94% on RA, adequate by my interpretation.    COORDINATION OF CARE: 10:22 AM Discussed treatment plan with pt at bedside and pt agreed to plan, which includes a dental block.   Labs (all labs ordered are listed, but only abnormal results are displayed) Labs Reviewed - No data to display  EKG  EKG Interpretation None        Radiology No results found.  Procedures Dental Block Date/Time: 08/02/2016 10:52 AM Performed by: Jaynie CrumbleKIRICHENKO, Hisako Bugh Authorized by: Jaynie CrumbleKIRICHENKO, Kapri Nero   Consent:    Consent obtained:  Verbal   Consent given by:  Patient   Risks discussed:  Allergic reaction, infection, nerve damage, swelling, pain and unsuccessful block   Alternatives discussed:  No treatment Indications:    Indications: dental abscess and dental pain   Location:    Block type:  Mental   Laterality:  Right Procedure details (see MAR for exact dosages):    Syringe type:  Aspirating dental syringe   Needle gauge:  25 G   Anesthetic injected:  Bupivacaine 0.5% WITH epi Post-procedure details:    Outcome:  Pain relieved   Patient tolerance of procedure:  Tolerated well, no immediate complications    (including critical care time)   Medications Ordered in ED Medications - No data to display   Initial Impression / Assessment and Plan / ED Course  I have reviewed the triage vital signs and the nursing notes.  Pertinent labs & imaging results that were available during my care of the patient were reviewed by me and considered in my medical decision making (see chart for details).     Patient with widespread dental decay and gingivitis, complaining of worsening pain to the right lower central and lateral incisors. She has an appointment in 6 days with a dentist. States they're supposed to pull her teeth out. At this time I suspect she may have an acute infection which is causing her to have increased pain. She requested dental block which was performed with good pain relief. Discharge home with penicillin, continue Advil, will give Peridex oral rinse. Return precautions discussed. She is afebrile, otherwise nontoxic-appearing. No concern for Ludwigns angina at this time.  Vitals:   08/02/16 0938  BP: (!) 120/92  Pulse: 86  Resp: 16  Temp: 98.6 F (37 C)  TempSrc: Oral  SpO2: 94%     Final  Clinical Impressions(s) / ED Diagnoses   Final diagnoses:  Dental caries  Toothache    New Prescriptions New Prescriptions   CHLORHEXIDINE (PERIDEX) 0.12 % SOLUTION    Use as directed 15 mLs in the mouth or throat 2 (two) times daily.   PENICILLIN V POTASSIUM (VEETID) 500 MG TABLET    Take 1 tablet (500 mg total) by mouth 4 (four) times daily.    I personally performed the services described in this documentation, which was scribed in my presence. The recorded information has been reviewed and is accurate.         Jaynie CrumbleKirichenko, Erminia Mcnew, PA-C 08/02/16 1135    Derwood KaplanNanavati, Ankit, MD 08/03/16 [phone]

## 2016-08-02 NOTE — Discharge Instructions (Signed)
Continue advil for pain. You can take tylenol for additional pain relief. Rinse your mouth with peridex rinse twice a day. Penicillin as prescribed until all gone. Follow up with a dentist as scheduled. Return if worsening.

## 2017-01-19 ENCOUNTER — Encounter (HOSPITAL_COMMUNITY): Payer: Self-pay

## 2017-01-19 ENCOUNTER — Other Ambulatory Visit: Payer: Self-pay

## 2017-01-19 ENCOUNTER — Emergency Department (HOSPITAL_COMMUNITY)
Admission: EM | Admit: 2017-01-19 | Discharge: 2017-01-19 | Disposition: A | Discharge status: Home / Self Care | Payer: BLUE CROSS/BLUE SHIELD | Attending: Emergency Medicine | Admitting: Emergency Medicine

## 2017-01-19 DIAGNOSIS — Z87891 Personal history of nicotine dependence: Secondary | ICD-10-CM | POA: Insufficient documentation

## 2017-01-19 DIAGNOSIS — K0889 Other specified disorders of teeth and supporting structures: Secondary | ICD-10-CM | POA: Diagnosis not present

## 2017-01-19 MED ORDER — IBUPROFEN 600 MG PO TABS: 600.0000 mg | ORAL_TABLET | Freq: Four times a day (QID) | ORAL | 0 refills | Status: AC | PRN

## 2017-01-19 MED ORDER — PENICILLIN V POTASSIUM 500 MG PO TABS: 500.0000 mg | ORAL_TABLET | Freq: Three times a day (TID) | ORAL | 0 refills | Status: AC

## 2017-01-19 MED ORDER — CHLORHEXIDINE GLUCONATE 0.12% ORAL RINSE (MEDLINE KIT): 15.0000 mL | Freq: Two times a day (BID) | OROMUCOSAL | 0 refills | Status: AC

## 2017-01-19 MED ORDER — BUPIVACAINE-EPINEPHRINE (PF) 0.5% -1:200000 IJ SOLN
1.8000 mL | Freq: Once | INTRAMUSCULAR | Status: AC
  Administered 2017-01-19: 1.8 mL
  Filled 2017-01-19: qty 1.8

## 2017-01-19 MED ORDER — IBUPROFEN 200 MG PO TABS
600.0000 mg | ORAL_TABLET | Freq: Once | ORAL | Status: AC
  Administered 2017-01-19: 600 mg via ORAL
  Filled 2017-01-19: qty 3

## 2017-01-19 MED ORDER — PENICILLIN V POTASSIUM 500 MG PO TABS
500.0000 mg | ORAL_TABLET | Freq: Once | ORAL | Status: AC
  Administered 2017-01-19: 500 mg via ORAL
  Filled 2017-01-19: qty 1

## 2017-01-19 NOTE — ED Provider Notes (Signed)
[facility] DEPT Provider Note   CSN: [id number] Arrival date & time: 01/19/17  0809     History   Chief Complaint Chief Complaint  Patient presents with  . Dental Pain    HPI Kimberly Lozano is a 32 y.o. female.  HPI  Kimberly Lozano is a 32 y.o. female who presents to the Emergency Department complaining of persistent, gradually worsening, right-sided, upper and lower dental pain beginning one week ago. Pt describes their pain as  throbbing. Pt has been taking ibuprofen at home with minimal relief of pain. Pain is exacerbated by cold substances and chewing. They are not currently followed by dentistry but do have dental insurance.  Pt denies facial swelling, fever, chills, difficulty breathing, difficulty swallowing.  Patient does have a history of dental infections.  Past Medical History:  Diagnosis Date  . Sciatica   . Scoliosis     There are no active problems to display for this patient.   Past Surgical History:  Procedure Laterality Date  . cesarian section      OB History    No data available       Home Medications    Prior to Admission medications   Medication Sig Start Date End Date Taking? Authorizing Provider  chlorhexidine gluconate, MEDLINE KIT, (PERIDEX) 0.12 % solution Use as directed 15 mLs in the mouth or throat 2 (two) times daily. 01/19/17   Langston Masker B, PA-C  ibuprofen (ADVIL,MOTRIN) 600 MG tablet Take 1 tablet (600 mg total) by mouth every 6 (six) hours as needed. 01/19/17   Langston Masker B, PA-C  penicillin v potassium (VEETID) 500 MG tablet Take 1 tablet (500 mg total) by mouth 3 (three) times daily for 7 days. 01/19/17 01/26/17  Albesa Seen, PA-C    Family History Family History  Problem Relation Age of Onset  . Diabetes Mother     Social History Social History   Tobacco Use  . Smoking status: Former Smoker    Packs/day: 0.50    Years: 9.00    Pack years: 4.50    Types: Cigarettes  . Smokeless  tobacco: Never Used  Substance Use Topics  . Alcohol use: Yes    Comment: rarely  . Drug use: No     Allergies   Phenergan [promethazine hcl] and Sulfa antibiotics   Review of Systems Review of Systems  Constitutional: Negative for chills and fever.  HENT: Positive for dental problem. Negative for facial swelling, trouble swallowing and voice change.   Respiratory: Negative for stridor.      Physical Exam Updated Vital Signs BP (!) 134/100   Pulse 84   Temp 98.7 F (37.1 C)   Resp 18   Ht '5\' 4"'  (1.626 m)   Wt 85.7 kg (189 lb)   SpO2 98%   BMI 32.44 kg/m   Physical Exam  Constitutional: She appears well-developed and well-nourished. No distress.  Sitting comfortably in bed.  HENT:  Head: Normocephalic and atraumatic.  Dental cavities and poor oral dentition noted. Pain along tooth as depicted in image. No abscess noted. Midline uvula. No trismus. OP clear and moist. No oropharyngeal erythema or edema. Neck supple with no tenderness. No facial edema.  There is a small white plaque on the gum superior to the affected tooth.  It is nonfluctuant.   Eyes: Conjunctivae are normal. Right eye exhibits no discharge. Left eye exhibits no discharge.  EOMs normal to gross examination.  Neck: Normal range of motion.  Pulmonary/Chest:  Effort normal and breath sounds normal.  Normal respiratory effort. Patient converses comfortably. No audible wheeze or stridor  Abdominal: She exhibits no distension.  Musculoskeletal: Normal range of motion.  Lymphadenopathy:    She has no cervical adenopathy.  Neurological: She is alert.  Cranial nerves intact to gross observation. Patient moves extremities without difficulty.  Skin: Skin is warm and dry. She is not diaphoretic.  Psychiatric: She has a normal mood and affect. Her behavior is normal. Judgment and thought content normal.  Nursing note and vitals reviewed.    ED Treatments / Results  Labs (all labs ordered are listed, but  only abnormal results are displayed) Labs Reviewed - No data to display  EKG  EKG Interpretation None       Radiology No results found.  Procedures Dental Block Date/Time: 01/19/2017 2:25 PM Performed by: Albesa Seen, PA-C Authorized by: Albesa Seen, PA-C   Consent:    Consent obtained:  Verbal   Consent given by:  Patient   Risks discussed:  Unsuccessful block and pain   Alternatives discussed:  No treatment Indications:    Indications: dental pain   Location:    Block type:  Supraperiosteal   Supraperiosteal location:  Upper teeth   Upper teeth location:  3/RU 1st molar Procedure details (see MAR for exact dosages):    Syringe type:  Aspirating dental syringe   Needle gauge:  27 G   Anesthetic injected:  Bupivacaine 0.25% WITH epi Post-procedure details:    Outcome:  Anesthesia achieved   Patient tolerance of procedure:  Tolerated well, no immediate complications   (including critical care time)  Medications Ordered in ED Medications  penicillin v potassium (VEETID) tablet 500 mg (500 mg Oral Given 01/19/17 1317)  ibuprofen (ADVIL,MOTRIN) tablet 600 mg (600 mg Oral Given 01/19/17 1317)  bupivacaine-epinephrine (MARCAINE W/ EPI) 0.5% -1:200000 injection 1.8 mL (1.8 mLs Infiltration Given 01/19/17 1317)     Initial Impression / Assessment and Plan / ED Course  I have reviewed the triage vital signs and the nursing notes.  Pertinent labs & imaging results that were available during my care of the patient were reviewed by me and considered in my medical decision making (see chart for details).     Final Clinical Impressions(s) / ED Diagnoses   Final diagnoses:  Pain, dental   Kimberly Lozano is a 32 y.o. female who presents to ED for dental pain. No abscess requiring immediate incision and drainage. Patient is afebrile, non toxic appearing, and swallowing secretions well. Exam not concerning for Ludwig's angina or pharyngeal abscess. Will treat with  Penicllin. I provided dental resource guide and stressed the importance of dental follow up for ultimate management of dental pain. Patient voices understanding and is agreeable to plan.  ED Discharge Orders        Ordered    chlorhexidine gluconate, MEDLINE KIT, (PERIDEX) 0.12 % solution  2 times daily     01/19/17 1422    penicillin v potassium (VEETID) 500 MG tablet  3 times daily     01/19/17 1422    ibuprofen (ADVIL,MOTRIN) 600 MG tablet  Every 6 hours PRN     01/19/17 1422       Albesa Seen, PA-C 01/19/17 1426    Charlesetta Shanks, MD 01/19/17 1732

## 2017-01-19 NOTE — Discharge Instructions (Signed)
Please see the information and instructions below regarding your visit.  Your diagnoses today include:  1. Pain, dental    You have a dental infection. It is very important that you get evaluated by a dentist as soon as possible. Call tomorrow to schedule an appointment. Ibuprofen as needed for pain. Take your full course of antibiotics. Read the instructions below.  Tests performed today include: See side panel of your discharge paperwork for testing performed today. Vital signs are listed at the bottom of these instructions.   Medications prescribed:    Take any prescribed medications only as prescribed, and any over the counter medications only as directed on the packaging.  1. You are prescribed Penicillin, an antibiotic. Please take all of your antibiotics until finished.   You may develop abdominal discomfort or nausea from the antibiotic. If this occurs, you may take it with food. Some patients also get diarrhea with antibiotics. You may help offset this with probiotics which you can buy or get in yogurt. Do not eat or take the probiotics until 2 hours after your antibiotic. Some women develop vaginal yeast infections after antibiotics. If you develop unusual vaginal discharge after being on this medication, please see your primary care provider.   Some people develop allergies to antibiotics. Symptoms of antibiotic allergy can be mild and include a flat rash and itching. They can also be more serious and include:  ?Hives - Hives are raised, red patches of skin that are usually very itchy.  ?Lip or tongue swelling  ?Trouble swallowing or breathing  ?Blistering of the skin or mouth.  If you have any of these serious symptoms, please seek emergency medical care immediately.  2. You are prescribed ibuprofen, a non-steroidal anti-inflammatory agent (NSAID) for pain. You may take 600mg  every 6 hours as needed for pain. If still requiring this medication around the clock for acute pain  after 10 days, please see your primary healthcare provider.  You may combine this medication with Tylenol, 650 mg every 6 hours, so you are receiving something for pain every 3 hours.  This is not a long-term medication unless under the care and direction of your primary provider. Taking this medication long-term and not under the supervision of a healthcare provider could increase the risk of stomach ulcers, kidney problems, and cardiovascular problems such as high blood pressure.   Home care instructions:  Please follow any educational materials contained in this packet.   Eat a soft or liquid diet and rinse your mouth out after meals with warm water. You should see a dentist or return here at once if you have increased swelling, increased pain or uncontrolled bleeding from the site of your injury.  Follow-up instructions: It is very important that you see a dentist as soon as possible. There is a list of dentists attached to this packet if you do not have care established with a dentist already. Please give a call to a dentist of your choice tomorrow.  Return instructions:  Please return to the Emergency Department if you experience worsening symptoms.  Please seek care if you note any of the following about your dental pain:  You have increased pain not controlled with medicines.  You have swelling around your tooth, in your face or neck.  You have bleeding which starts, continues, or gets worse.  You have a fever >101 If you are unable to open your mouth Please return if you have any other emergent concerns.  Additional Information:  Your vital signs today were: BP (!) 134/100    Pulse 84    Temp 98.7 F (37.1 C)    Resp 18    Ht 5\' 4"  (1.626 m)    Wt 85.7 kg (189 lb)    SpO2 98%    BMI 32.44 kg/m  If your blood pressure (BP) was elevated on multiple readings during this visit above 130 for the top number or above 80 for the bottom number, please have this repeated by your  primary care provider within one month. --------------  Thank you for allowing us to participate in your care today.

## 2017-01-19 NOTE — ED Triage Notes (Signed)
Patient c/o right upper dental pain and slight swelling to the right face x 2-3 weeks.

## 2017-01-19 NOTE — ED Notes (Signed)
Bed: WTR7 Expected date:  Expected time:  Means of arrival:  Comments: 

## 2017-02-16 ENCOUNTER — Encounter (HOSPITAL_COMMUNITY): Payer: Self-pay | Admitting: Emergency Medicine

## 2017-02-16 ENCOUNTER — Other Ambulatory Visit: Payer: Self-pay

## 2017-02-16 ENCOUNTER — Emergency Department (HOSPITAL_COMMUNITY)
Admission: EM | Admit: 2017-02-16 | Discharge: 2017-02-16 | Disposition: A | Discharge status: Home / Self Care | Payer: Worker's Compensation | Attending: Emergency Medicine | Admitting: Emergency Medicine

## 2017-02-16 ENCOUNTER — Emergency Department (HOSPITAL_COMMUNITY): Payer: Worker's Compensation

## 2017-02-16 DIAGNOSIS — Y9389 Activity, other specified: Secondary | ICD-10-CM | POA: Diagnosis not present

## 2017-02-16 DIAGNOSIS — Z87891 Personal history of nicotine dependence: Secondary | ICD-10-CM | POA: Diagnosis not present

## 2017-02-16 DIAGNOSIS — S92424A Nondisplaced fracture of distal phalanx of right great toe, initial encounter for closed fracture: Secondary | ICD-10-CM | POA: Diagnosis not present

## 2017-02-16 DIAGNOSIS — Y990 Civilian activity done for income or pay: Secondary | ICD-10-CM | POA: Diagnosis not present

## 2017-02-16 DIAGNOSIS — W208XXA Other cause of strike by thrown, projected or falling object, initial encounter: Secondary | ICD-10-CM | POA: Diagnosis not present

## 2017-02-16 DIAGNOSIS — Y9289 Other specified places as the place of occurrence of the external cause: Secondary | ICD-10-CM | POA: Diagnosis not present

## 2017-02-16 DIAGNOSIS — S99921A Unspecified injury of right foot, initial encounter: Secondary | ICD-10-CM | POA: Diagnosis present

## 2017-02-16 MED ORDER — IBUPROFEN 200 MG PO TABS
600.0000 mg | ORAL_TABLET | Freq: Once | ORAL | Status: AC
  Administered 2017-02-16: 600 mg via ORAL
  Filled 2017-02-16: qty 3

## 2017-02-16 MED ORDER — ACETAMINOPHEN 325 MG PO TABS
650.0000 mg | ORAL_TABLET | Freq: Once | ORAL | Status: AC
  Administered 2017-02-16: 650 mg via ORAL
  Filled 2017-02-16: qty 2

## 2017-02-16 NOTE — ED Notes (Signed)
Cleaned laceration on right big toe with Normal saline gauze. Wet gauze on top. Patient can not tolerate a ice pack.

## 2017-02-16 NOTE — ED Triage Notes (Signed)
Pt complaining of right foot pain. Pt was at work and dropped a pallet on her foot. Right great toe bleeding currently.

## 2017-02-16 NOTE — Discharge Instructions (Signed)
Contact [facility]

## 2017-02-16 NOTE — ED Provider Notes (Signed)
[facility] DEPT Provider Note   CSN: [id number] Arrival date & time: 02/16/17  0630     History   Chief Complaint Chief Complaint  Patient presents with  . Foot Injury    HPI Kimberly Lozano is a 32 y.o. female.  HPI Pt is a 32 year old female presents the emergency department with right great toe pain after dropping a palate on her toe.  She presents with moderate pain.  No other injury.  No significant bleeding.  No dislodgment of her nail.  She works at [employer].  This was a work-related injury.   Past Medical History:  Diagnosis Date  . Sciatica   . Scoliosis     There are no active problems to display for this patient.   Past Surgical History:  Procedure Laterality Date  . cesarian section      OB History    No data available       Home Medications    Prior to Admission medications   Medication Sig Start Date End Date Taking? Authorizing Provider  chlorhexidine gluconate, MEDLINE KIT, (PERIDEX) 0.12 % solution Use as directed 15 mLs in the mouth or throat 2 (two) times daily. 01/19/17   Langston Masker B, PA-C  ibuprofen (ADVIL,MOTRIN) 600 MG tablet Take 1 tablet (600 mg total) by mouth every 6 (six) hours as needed. 01/19/17   Albesa Seen, PA-C    Family History Family History  Problem Relation Age of Onset  . Diabetes Mother     Social History Social History   Tobacco Use  . Smoking status: Former Smoker    Packs/day: 0.50    Years: 9.00    Pack years: 4.50    Types: Cigarettes  . Smokeless tobacco: Never Used  Substance Use Topics  . Alcohol use: Yes    Comment: rarely  . Drug use: No     Allergies   Phenergan [promethazine hcl] and Sulfa antibiotics   Review of Systems Review of Systems  All other systems reviewed and are negative.    Physical Exam Updated Vital Signs BP (!) 125/100 (BP Location: Left Arm)   Pulse 95   Temp 98.2 F (36.8 C) (Oral)   Resp 20   SpO2 98%   Physical Exam    Constitutional: She is oriented to person, place, and time. She appears well-developed and well-nourished.  HENT:  Head: Normocephalic.  Eyes: EOM are normal.  Neck: Normal range of motion.  Pulmonary/Chest: Effort normal.  Abdominal: She exhibits no distension.  Musculoskeletal: Normal range of motion.  Right great toe with mild swelling and tenderness.  Mild dried blood on the lateral aspects of the nail with the nail does not appear dislodged or loose.  No subungual hematoma noted.  Normal pulses in the right foot.  Compartments of the right foot are soft  Neurological: She is alert and oriented to person, place, and time.  Psychiatric: She has a normal mood and affect.  Nursing note and vitals reviewed.    ED Treatments / Results  Labs (all labs ordered are listed, but only abnormal results are displayed) Labs Reviewed - No data to display  EKG  EKG Interpretation None       Radiology Dg Foot Complete Right  Result Date: 02/16/2017 CLINICAL DATA:  Dropped Pallet on foot. Right foot pain. Initial encounter. EXAM: RIGHT FOOT COMPLETE - 3+ VIEW COMPARISON:  None. FINDINGS: A nondisplaced fracture of the terminal tuft of the distal phalanx of the great toe  is seen. No other fractures identified. No evidence of dislocation. IMPRESSION: Nondisplaced fracture of tuft of distal phalanx of great toe. Electronically Signed   By: Earle Gell M.D.   On: 02/16/2017 08:09    Procedures Procedures (including critical care time)   +++++++++++++++++++++++++++++++++++++++++++++++++  Definitive Fracture Care  Definitive fracture care was performed for the patient's distal right great toe tuft fracture. Treatment includes management of pain and post op shoe Fracture related discharge instructions were provided Symptomatic control measures provided to the patient   ++++++++++++++++++++++++++++++++++++++++++++++++  Medications Ordered in ED Medications  ibuprofen (ADVIL,MOTRIN)  tablet 600 mg (600 mg Oral Given 02/16/17 0819)  acetaminophen (TYLENOL) tablet 650 mg (650 mg Oral Given 02/16/17 0819)     Initial Impression / Assessment and Plan / ED Course  I have reviewed the triage vital signs and the nursing notes.  Pertinent labs & imaging results that were available during my care of the patient were reviewed by me and considered in my medical decision making (see chart for details).     Postop shoe.  Weightbearing as tolerated.  Referral to occupational health for work restrictions.  Ibuprofen and Tylenol for pain.  Definitive fracture care performed.  This involves postop shoe and symptom control.   Final Clinical Impressions(s) / ED Diagnoses   Final diagnoses:  Nondisplaced fracture of distal phalanx of right great toe, initial encounter for closed fracture    ED Discharge Orders    None       Jola Schmidt, MD 02/16/17 [phone]

## 2017-04-06 ENCOUNTER — Emergency Department (HOSPITAL_COMMUNITY)
Admission: EM | Admit: 2017-04-06 | Discharge: 2017-04-06 | Disposition: A | Discharge status: Home / Self Care | Payer: BLUE CROSS/BLUE SHIELD | Attending: Emergency Medicine | Admitting: Emergency Medicine

## 2017-04-06 ENCOUNTER — Other Ambulatory Visit: Payer: Self-pay

## 2017-04-06 ENCOUNTER — Encounter (HOSPITAL_COMMUNITY): Payer: Self-pay

## 2017-04-06 DIAGNOSIS — K0889 Other specified disorders of teeth and supporting structures: Secondary | ICD-10-CM | POA: Diagnosis not present

## 2017-04-06 DIAGNOSIS — Z87891 Personal history of nicotine dependence: Secondary | ICD-10-CM | POA: Diagnosis not present

## 2017-04-06 MED ORDER — PENICILLIN V POTASSIUM 500 MG PO TABS: 500.0000 mg | ORAL_TABLET | Freq: Four times a day (QID) | ORAL | 0 refills | Status: AC

## 2017-04-06 NOTE — Discharge Instructions (Addendum)
Follow-up with a dentist.  There is no dentist on call today.  Return for worsening of symptoms.

## 2017-04-06 NOTE — ED Provider Notes (Signed)
[facility] DEPT Provider Note   CSN: [id number] Arrival date & time: 04/06/17  0830     History   Chief Complaint Chief Complaint  Patient presents with  . Dental Pain    HPI Kimberly Lozano is a 32 y.o. female.  HPI Patient presents with right-sided upper dental pain for the last 3 days.  Some relief with Motrin.  Worsening pain and does have some facial swelling.  No difficulty swallowing.  Pain is dull.  Worse with eating.  Has had previous dental infections.  Does not have a dentist.  No difficulty breathing.  Pain is on the right upper mid teeth. Past Medical History:  Diagnosis Date  . Sciatica   . Scoliosis     There are no active problems to display for this patient.   Past Surgical History:  Procedure Laterality Date  . cesarian section      OB History   None      Home Medications    Prior to Admission medications   Medication Sig Start Date End Date Taking? Authorizing Provider  chlorhexidine gluconate, MEDLINE KIT, (PERIDEX) 0.12 % solution Use as directed 15 mLs in the mouth or throat 2 (two) times daily. 01/19/17   Langston Masker B, PA-C  ibuprofen (ADVIL,MOTRIN) 600 MG tablet Take 1 tablet (600 mg total) by mouth every 6 (six) hours as needed. 01/19/17   Langston Masker B, PA-C  penicillin v potassium (VEETID) 500 MG tablet Take 1 tablet (500 mg total) by mouth 4 (four) times daily for 7 days. 04/06/17 04/13/17  Davonna Belling, MD    Family History Family History  Problem Relation Age of Onset  . Diabetes Mother     Social History Social History   Tobacco Use  . Smoking status: Former Smoker    Packs/day: 0.50    Years: 9.00    Pack years: 4.50    Types: Cigarettes  . Smokeless tobacco: Never Used  Substance Use Topics  . Alcohol use: Yes    Comment: rarely  . Drug use: No     Allergies   Phenergan [promethazine hcl] and Sulfa antibiotics   Review of Systems Review of Systems  Constitutional:  Negative for appetite change and fever.  HENT: Positive for dental problem and facial swelling. Negative for trouble swallowing.   Respiratory: Negative for shortness of breath.   Cardiovascular: Negative for chest pain.  Gastrointestinal: Negative for abdominal pain.  Skin: Negative for wound.     Physical Exam Updated Vital Signs BP (!) 126/91 (BP Location: Left Arm)   Pulse 86   Temp 98.8 F (37.1 C) (Oral)   Resp 16   Ht '5\' 4"'  (0.034 m)   Wt 89.4 kg (197 lb)   SpO2 97%   BMI 33.81 kg/m   Physical Exam  Constitutional: She appears well-developed.  HENT:  Head: Atraumatic.  Some tenderness over right upper fourth through 6 tooth from the midline.  Some tenderness over the gums laterally but no fluctuance.  Slight swelling of the face.  No stridor.  Teeth are grossly intact.  Eyes: Pupils are equal, round, and reactive to light.  Cardiovascular: Normal rate.  Pulmonary/Chest: Effort normal.  Abdominal: Soft.     ED Treatments / Results  Labs (all labs ordered are listed, but only abnormal results are displayed) Labs Reviewed - No data to display  EKG  EKG Interpretation None       Radiology No results found.  Procedures Procedures (including critical  care time)  Medications Ordered in ED Medications - No data to display   Initial Impression / Assessment and Plan / ED Course  I have reviewed the triage vital signs and the nursing notes.  Pertinent labs & imaging results that were available during my care of the patient were reviewed by me and considered in my medical decision making (see chart for details).     Patient with dental pain.  Will treat with antibiotics.  No clear impending airway obstruction or drainable abscess.  No dentist on call today.  Will discharge home.  Final Clinical Impressions(s) / ED Diagnoses   Final diagnoses:  Pain, dental    ED Discharge Orders        Ordered    penicillin v potassium (VEETID) 500 MG tablet  4  times daily     04/06/17 0913       Davonna Belling, MD 04/06/17 [phone]

## 2017-04-06 NOTE — ED Triage Notes (Signed)
patient c/o right upper dental pain x 3 days. patient has right facial swelling. Patient denies any problems swallowing or breathing.

## 2018-02-19 IMAGING — CR DG FOOT COMPLETE 3+V*R*
3 series · 3 of 3 positions shown · non-contrast
Comparison: None.

CLINICAL DATA: Dropped Pallet on foot. Right foot pain. Initial
encounter.

EXAM:
RIGHT FOOT COMPLETE - 3+ VIEW

[x foot ap right]
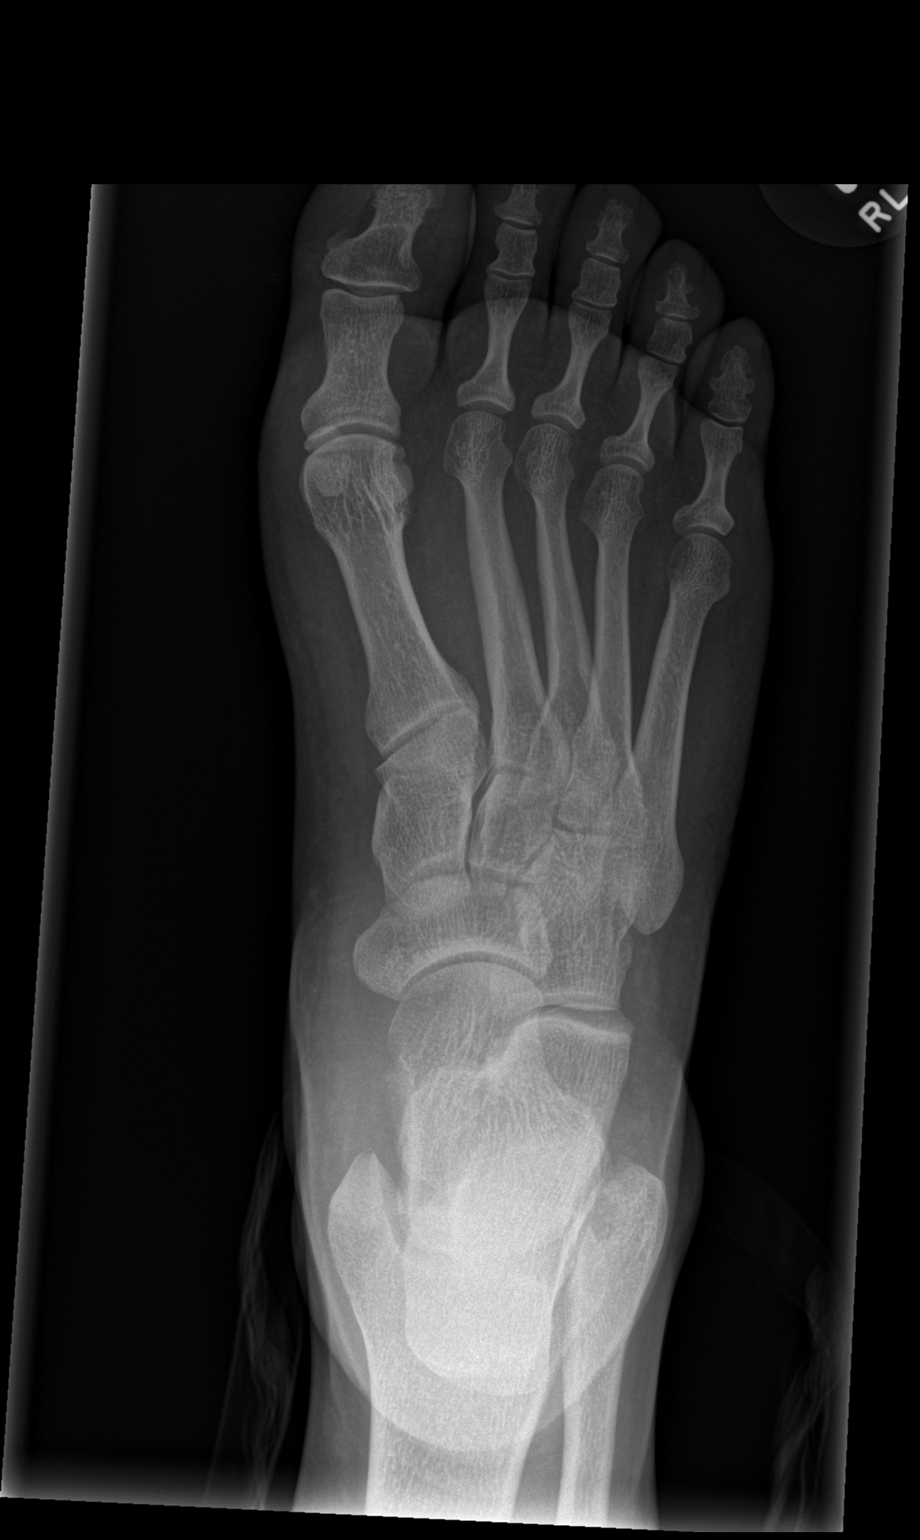

[x foot obl right]
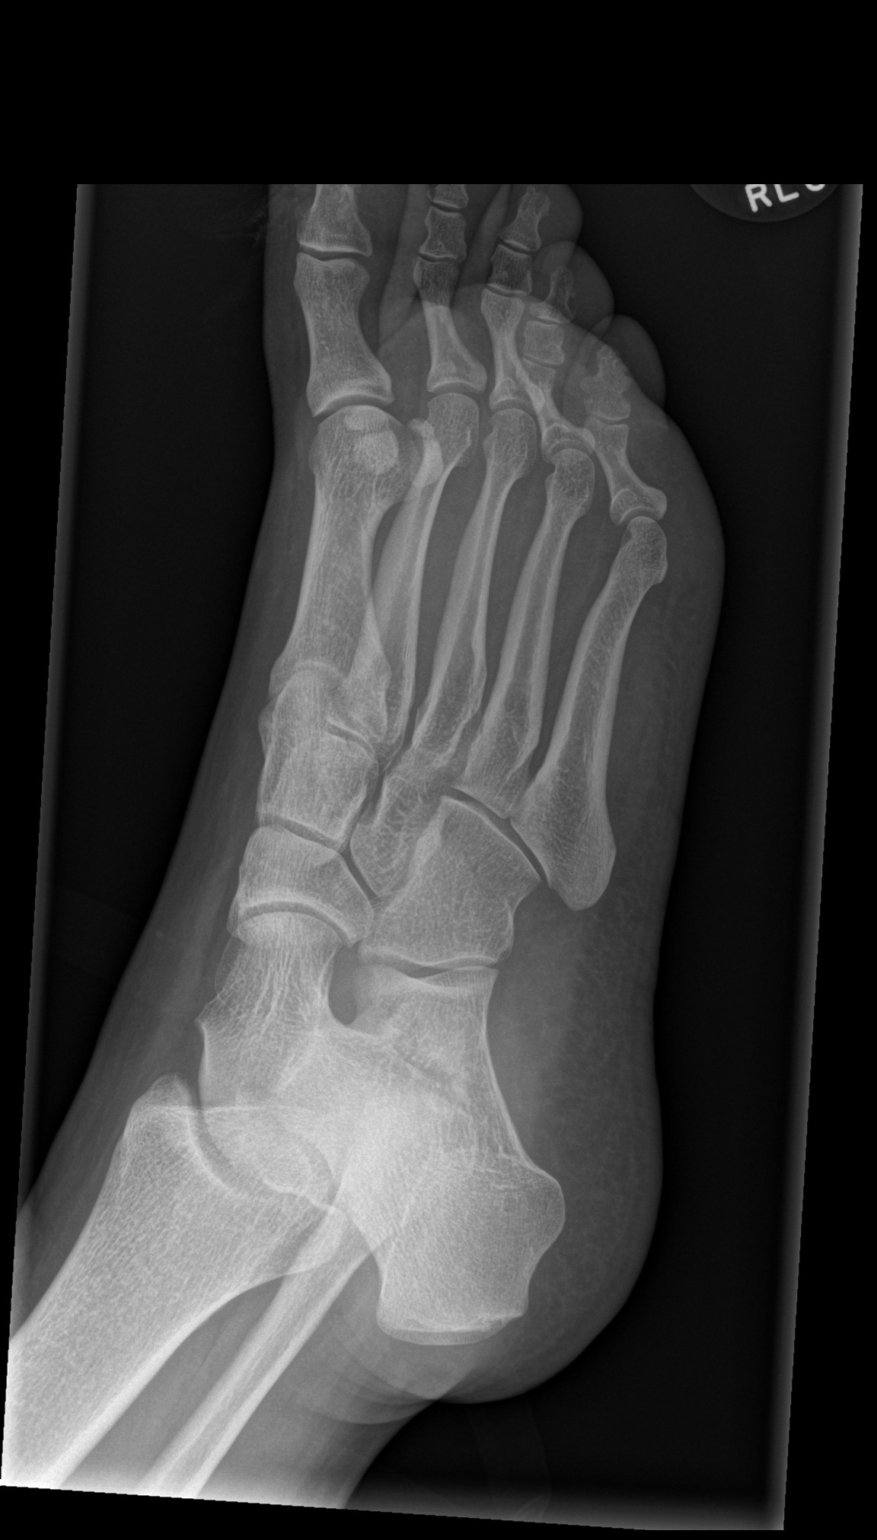

[x foot lat right]
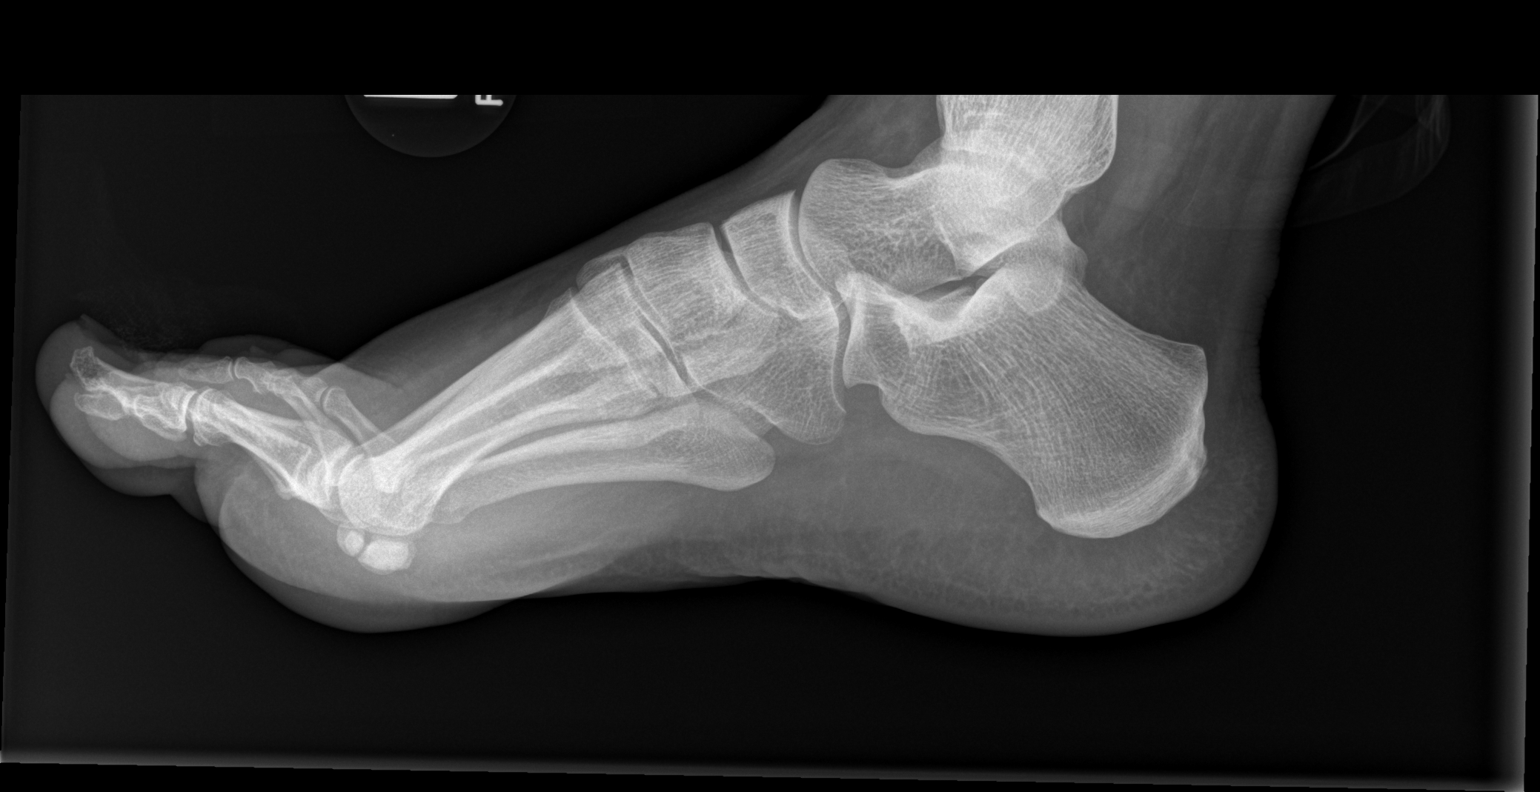

[3 of 3 positions shown; findings below may reference images not displayed]

FINDINGS: A nondisplaced fracture of the terminal tuft of the distal phalanx
of the great toe is seen. No other fractures identified. No evidence
of dislocation.
IMPRESSION: Nondisplaced fracture of tuft of distal phalanx of great toe.
